# Patient Record
Sex: Female | Born: 1945 | Race: White | Hispanic: No | Marital: Married | Smoking: Never smoker
Health system: Southern US, Community
[De-identification: ages and names within clinical notes are randomized; demographics above are authoritative.]

## PROBLEM LIST (undated history)

## (undated) DIAGNOSIS — K219 Gastro-esophageal reflux disease without esophagitis: Secondary | ICD-10-CM

## (undated) DIAGNOSIS — E7800 Pure hypercholesterolemia, unspecified: Secondary | ICD-10-CM

## (undated) DIAGNOSIS — M549 Dorsalgia, unspecified: Secondary | ICD-10-CM

## (undated) DIAGNOSIS — I10 Essential (primary) hypertension: Secondary | ICD-10-CM

## (undated) DIAGNOSIS — M1990 Unspecified osteoarthritis, unspecified site: Secondary | ICD-10-CM

## (undated) DIAGNOSIS — M810 Age-related osteoporosis without current pathological fracture: Secondary | ICD-10-CM

## (undated) HISTORY — DX: Age-related osteoporosis without current pathological fracture: M81.0

## (undated) HISTORY — DX: Gastro-esophageal reflux disease without esophagitis: K21.9

## (undated) HISTORY — PX: ABDOMINAL HYSTERECTOMY: SHX81

## (undated) HISTORY — PX: WRIST SURGERY: SHX841

## (undated) HISTORY — PX: COLONOSCOPY: SHX174

---

## 2007-02-02 DIAGNOSIS — C50919 Malignant neoplasm of unspecified site of unspecified female breast: Secondary | ICD-10-CM

## 2007-02-02 HISTORY — DX: Malignant neoplasm of unspecified site of unspecified female breast: C50.919

## 2007-11-02 HISTORY — PX: MASTECTOMY: SHX3

## 2010-08-14 ENCOUNTER — Other Ambulatory Visit: Payer: Self-pay | Admitting: Neurosurgery

## 2010-08-14 DIAGNOSIS — M48061 Spinal stenosis, lumbar region without neurogenic claudication: Secondary | ICD-10-CM

## 2010-08-17 ENCOUNTER — Ambulatory Visit
Admission: RE | Admit: 2010-08-17 | Discharge: 2010-08-17 | Disposition: A | Discharge status: Home / Self Care | Payer: Medicare Other | Source: Ambulatory Visit | Attending: Neurosurgery | Admitting: Neurosurgery

## 2010-08-17 DIAGNOSIS — M48061 Spinal stenosis, lumbar region without neurogenic claudication: Secondary | ICD-10-CM

## 2010-08-21 ENCOUNTER — Other Ambulatory Visit: Payer: Self-pay

## 2010-08-25 ENCOUNTER — Other Ambulatory Visit: Payer: Self-pay | Admitting: Neurosurgery

## 2010-08-25 DIAGNOSIS — Z13820 Encounter for screening for osteoporosis: Secondary | ICD-10-CM

## 2010-08-31 ENCOUNTER — Other Ambulatory Visit: Payer: Self-pay | Admitting: Neurosurgery

## 2010-08-31 ENCOUNTER — Ambulatory Visit
Admission: RE | Admit: 2010-08-31 | Discharge: 2010-08-31 | Disposition: A | Discharge status: Home / Self Care | Payer: Medicare Other | Source: Ambulatory Visit | Attending: Neurosurgery | Admitting: Neurosurgery

## 2010-08-31 DIAGNOSIS — IMO0002 Reserved for concepts with insufficient information to code with codable children: Secondary | ICD-10-CM

## 2010-08-31 DIAGNOSIS — Z13820 Encounter for screening for osteoporosis: Secondary | ICD-10-CM

## 2010-08-31 DIAGNOSIS — M4310 Spondylolisthesis, site unspecified: Secondary | ICD-10-CM

## 2010-11-17 DIAGNOSIS — M47816 Spondylosis without myelopathy or radiculopathy, lumbar region: Secondary | ICD-10-CM | POA: Insufficient documentation

## 2010-11-17 DIAGNOSIS — I10 Essential (primary) hypertension: Secondary | ICD-10-CM | POA: Insufficient documentation

## 2012-08-31 ENCOUNTER — Other Ambulatory Visit: Payer: Self-pay | Admitting: Family Medicine

## 2012-08-31 DIAGNOSIS — M8580 Other specified disorders of bone density and structure, unspecified site: Secondary | ICD-10-CM

## 2012-09-08 ENCOUNTER — Other Ambulatory Visit: Payer: Self-pay

## 2012-09-08 DIAGNOSIS — Z1231 Encounter for screening mammogram for malignant neoplasm of breast: Secondary | ICD-10-CM

## 2012-09-11 ENCOUNTER — Other Ambulatory Visit: Payer: Medicare Other

## 2012-09-29 ENCOUNTER — Ambulatory Visit
Admission: RE | Admit: 2012-09-29 | Discharge: 2012-09-29 | Disposition: A | Discharge status: Home / Self Care | Payer: Medicare Other | Source: Ambulatory Visit | Attending: Family Medicine | Admitting: Family Medicine

## 2012-09-29 ENCOUNTER — Ambulatory Visit: Payer: Medicare Other

## 2012-09-29 ENCOUNTER — Ambulatory Visit
Admission: RE | Admit: 2012-09-29 | Discharge: 2012-09-29 | Disposition: A | Discharge status: Home / Self Care | Payer: Medicare Other | Source: Ambulatory Visit

## 2012-09-29 DIAGNOSIS — M8580 Other specified disorders of bone density and structure, unspecified site: Secondary | ICD-10-CM

## 2012-09-29 DIAGNOSIS — Z1231 Encounter for screening mammogram for malignant neoplasm of breast: Secondary | ICD-10-CM

## 2012-11-13 DIAGNOSIS — N8110 Cystocele, unspecified: Secondary | ICD-10-CM | POA: Insufficient documentation

## 2013-01-23 DIAGNOSIS — C50919 Malignant neoplasm of unspecified site of unspecified female breast: Secondary | ICD-10-CM | POA: Insufficient documentation

## 2013-01-23 DIAGNOSIS — Z853 Personal history of malignant neoplasm of breast: Secondary | ICD-10-CM | POA: Insufficient documentation

## 2014-04-09 ENCOUNTER — Other Ambulatory Visit: Payer: Self-pay

## 2014-04-09 DIAGNOSIS — Z1231 Encounter for screening mammogram for malignant neoplasm of breast: Secondary | ICD-10-CM

## 2014-04-09 DIAGNOSIS — Z9012 Acquired absence of left breast and nipple: Secondary | ICD-10-CM

## 2014-04-26 ENCOUNTER — Ambulatory Visit
Admission: RE | Admit: 2014-04-26 | Discharge: 2014-04-26 | Disposition: A | Discharge status: Home / Self Care | Payer: Medicare HMO | Source: Ambulatory Visit

## 2014-04-26 DIAGNOSIS — Z1231 Encounter for screening mammogram for malignant neoplasm of breast: Secondary | ICD-10-CM

## 2014-04-26 DIAGNOSIS — Z9012 Acquired absence of left breast and nipple: Secondary | ICD-10-CM

## 2015-09-03 ENCOUNTER — Other Ambulatory Visit: Payer: Self-pay | Admitting: Family Medicine

## 2015-09-03 DIAGNOSIS — Z1231 Encounter for screening mammogram for malignant neoplasm of breast: Secondary | ICD-10-CM

## 2015-09-12 ENCOUNTER — Ambulatory Visit: Payer: Medicare HMO

## 2015-10-28 ENCOUNTER — Ambulatory Visit
Admission: RE | Admit: 2015-10-28 | Discharge: 2015-10-28 | Disposition: A | Discharge status: Home / Self Care | Payer: Medicare Other | Source: Ambulatory Visit | Attending: Family Medicine | Admitting: Family Medicine

## 2015-10-28 DIAGNOSIS — Z1231 Encounter for screening mammogram for malignant neoplasm of breast: Secondary | ICD-10-CM

## 2016-12-07 ENCOUNTER — Other Ambulatory Visit: Payer: Self-pay

## 2016-12-07 ENCOUNTER — Other Ambulatory Visit: Payer: Self-pay | Admitting: Family Medicine

## 2016-12-07 DIAGNOSIS — N644 Mastodynia: Secondary | ICD-10-CM

## 2016-12-17 ENCOUNTER — Ambulatory Visit
Admission: RE | Admit: 2016-12-17 | Discharge: 2016-12-17 | Disposition: A | Discharge status: Home / Self Care | Payer: Medicare Other | Source: Ambulatory Visit

## 2016-12-17 ENCOUNTER — Ambulatory Visit: Payer: Medicare Other

## 2016-12-17 ENCOUNTER — Ambulatory Visit
Admission: RE | Admit: 2016-12-17 | Discharge: 2016-12-17 | Disposition: A | Discharge status: Home / Self Care | Payer: Medicare Other | Source: Ambulatory Visit | Attending: Family Medicine | Admitting: Family Medicine

## 2016-12-17 ENCOUNTER — Other Ambulatory Visit: Payer: Self-pay | Admitting: Family Medicine

## 2016-12-17 DIAGNOSIS — N644 Mastodynia: Secondary | ICD-10-CM

## 2017-05-05 DIAGNOSIS — E785 Hyperlipidemia, unspecified: Secondary | ICD-10-CM | POA: Insufficient documentation

## 2017-05-19 ENCOUNTER — Other Ambulatory Visit: Payer: Self-pay

## 2017-05-19 ENCOUNTER — Encounter (HOSPITAL_BASED_OUTPATIENT_CLINIC_OR_DEPARTMENT_OTHER): Payer: Self-pay | Admitting: Emergency Medicine

## 2017-05-19 ENCOUNTER — Emergency Department (HOSPITAL_BASED_OUTPATIENT_CLINIC_OR_DEPARTMENT_OTHER)
Admission: EM | Admit: 2017-05-19 | Discharge: 2017-05-19 | Disposition: A | Discharge status: Home / Self Care | Payer: Medicare Other | Attending: Emergency Medicine | Admitting: Emergency Medicine

## 2017-05-19 DIAGNOSIS — M25532 Pain in left wrist: Secondary | ICD-10-CM | POA: Insufficient documentation

## 2017-05-19 DIAGNOSIS — I10 Essential (primary) hypertension: Secondary | ICD-10-CM | POA: Insufficient documentation

## 2017-05-19 DIAGNOSIS — Z79899 Other long term (current) drug therapy: Secondary | ICD-10-CM | POA: Insufficient documentation

## 2017-05-19 DIAGNOSIS — G8918 Other acute postprocedural pain: Secondary | ICD-10-CM | POA: Insufficient documentation

## 2017-05-19 HISTORY — DX: Dorsalgia, unspecified: M54.9

## 2017-05-19 HISTORY — DX: Pure hypercholesterolemia, unspecified: E78.00

## 2017-05-19 HISTORY — DX: Essential (primary) hypertension: I10

## 2017-05-19 HISTORY — DX: Unspecified osteoarthritis, unspecified site: M19.90

## 2017-05-19 MED ORDER — HYDROMORPHONE HCL 1 MG/ML IJ SOLN
1.0000 mg | Freq: Once | INTRAMUSCULAR | Status: AC
  Administered 2017-05-19: 1 mg via INTRAVENOUS
  Filled 2017-05-19: qty 1

## 2017-05-19 NOTE — Discharge Instructions (Addendum)
Continue your medications as previously prescribed.  Follow-up with Dr. Apolonio Schneiders as scheduled, and return to the ER if symptoms significantly worsen or change.

## 2017-05-19 NOTE — ED Provider Notes (Signed)
[facility] EMERGENCY DEPARTMENT Provider Note   CSN: [id number] Arrival date & time: 05/19/17  0415     History   Chief Complaint Chief Complaint  Patient presents with  . Post-op Problem    HPI Misty Mason is a 72 y.o. female.  This patient is a 72 year old female with past medical history of hypertension, hypercholesterolemia, and recent ORIF of the left wrist.  She presents today for evaluation of postoperative pain.  Dr. Apolonio Schneiders performed surgery yesterday and she was discharged to home.  This evening, her pain medicine has worn off and she is complaining of severe pain, "like someone is tearing my arm off".  She denies any numbness or tingling of the fingers.  She denies any new injury or trauma.  The history is provided by the patient.    Past Medical History:  Diagnosis Date  . Arthritis   . Back pain   . High cholesterol   . Hypertension     There are no active problems to display for this patient.   Past Surgical History:  Procedure Laterality Date  . MASTECTOMY Left 11/2007  . WRIST SURGERY       OB History   None      Home Medications    Prior to Admission medications   Medication Sig Start Date End Date Taking? Authorizing Provider  atorvastatin (LIPITOR) 40 MG tablet Take 40 mg by mouth 1 day or 1 dose. 03/07/17  Yes [provider]  hydroxychloroquine (PLAQUENIL) 200 MG tablet Take 200 mg by mouth 1 day or 1 dose. 03/02/17   [provider]  ondansetron (ZOFRAN) 4 MG tablet Take 4 mg by mouth as needed. 05/06/17   [provider]  oxyCODONE-acetaminophen (PERCOCET/ROXICET) 5-325 MG tablet Take 5-325 tablets by mouth as needed. 05/18/17   [provider]  traZODone (DESYREL) 100 MG tablet Take 100 mg by mouth at bedtime. 04/09/17   [provider]    Family History No family history on file.  Social History Social History   Tobacco Use  . Smoking status: Never Smoker  . Smokeless tobacco:  Never Used  Substance Use Topics  . Alcohol use: Yes    Comment: drinks 2 glasses of wine q hs  . Drug use: Never     Allergies   Patient has no known allergies.   Review of Systems Review of Systems  All other systems reviewed and are negative.    Physical Exam Updated Vital Signs BP 126/63 (BP Location: Right Arm)   Pulse 74   Temp 98.1 F (36.7 C) (Oral)   Resp (!) 22   Ht 5\' 2"  (1.575 m)   Wt 77.1 kg (170 lb)   SpO2 97%   BMI 31.09 kg/m   Physical Exam  Constitutional: She is oriented to person, place, and time. She appears well-developed and well-nourished. No distress.  HENT:  Head: Normocephalic and atraumatic.  Neck: Normal range of motion. Neck supple.  Musculoskeletal:  The left forearm has a dressing and splint in place.  The fingertips are not significantly swollen.  She has brisk capillary refill and motor and sensation is intact all fingers.  Neurological: She is alert and oriented to person, place, and time.  Skin: Skin is warm and dry. She is not diaphoretic.  Nursing note and vitals reviewed.    ED Treatments / Results  Labs (all labs ordered are listed, but only abnormal results are displayed) Labs Reviewed - No data to display  EKG None  Radiology No results found.  Procedures Procedures (including critical care time)  Medications Ordered in ED Medications  HYDROmorphone (DILAUDID) injection 1 mg (has no administration in time range)     Initial Impression / Assessment and Plan / ED Course  I have reviewed the triage vital signs and the nursing notes.  Pertinent labs & imaging results that were available during my care of the patient were reviewed by me and considered in my medical decision making (see chart for details).  Patient is one day postop from ORIF of the left wrist performed by Dr. Apolonio Schneiders.  She is having severe pain that is unrelieved with her home meds.  She was sent here for pain control.  Her fingers appear well  perfused and I see no evidence that the cast is on too tight.  She was given 1 mg of IV Dilaudid and is now significantly improved.  She is requesting to go home.  Final Clinical Impressions(s) / ED Diagnoses   Final diagnoses:  None    ED Discharge Orders    None       Veryl Speak, MD 05/19/17 [phone]

## 2017-05-19 NOTE — ED Triage Notes (Signed)
Had ? ORIF to left wrist yesterday. Has taken Percoet 3 tabs since 0030 but continues to have 10/10 pain. Has splint intact to left arm, CMS intact, wiggles fingers freely.

## 2018-11-06 DIAGNOSIS — H903 Sensorineural hearing loss, bilateral: Secondary | ICD-10-CM | POA: Insufficient documentation

## 2018-11-06 DIAGNOSIS — H8111 Benign paroxysmal vertigo, right ear: Secondary | ICD-10-CM | POA: Insufficient documentation

## 2018-11-29 ENCOUNTER — Other Ambulatory Visit: Payer: Self-pay | Admitting: Family Medicine

## 2018-11-29 DIAGNOSIS — Z1231 Encounter for screening mammogram for malignant neoplasm of breast: Secondary | ICD-10-CM

## 2018-12-04 ENCOUNTER — Ambulatory Visit
Admission: RE | Admit: 2018-12-04 | Discharge: 2018-12-04 | Disposition: A | Discharge status: Home / Self Care | Payer: Medicare Other | Source: Ambulatory Visit | Attending: Family Medicine | Admitting: Family Medicine

## 2018-12-04 ENCOUNTER — Other Ambulatory Visit: Payer: Self-pay

## 2018-12-04 ENCOUNTER — Other Ambulatory Visit: Payer: Self-pay | Admitting: Family Medicine

## 2018-12-04 DIAGNOSIS — Z1231 Encounter for screening mammogram for malignant neoplasm of breast: Secondary | ICD-10-CM

## 2019-03-20 LAB — COLOGUARD: COLOGUARD: NEGATIVE

## 2019-10-04 DIAGNOSIS — M5137 Other intervertebral disc degeneration, lumbosacral region: Secondary | ICD-10-CM | POA: Insufficient documentation

## 2020-02-28 DIAGNOSIS — I10 Essential (primary) hypertension: Secondary | ICD-10-CM | POA: Diagnosis not present

## 2020-02-28 DIAGNOSIS — M810 Age-related osteoporosis without current pathological fracture: Secondary | ICD-10-CM | POA: Diagnosis not present

## 2020-02-28 DIAGNOSIS — R053 Chronic cough: Secondary | ICD-10-CM | POA: Diagnosis not present

## 2020-02-28 DIAGNOSIS — Z0000 Encounter for general adult medical examination without abnormal findings: Secondary | ICD-10-CM | POA: Diagnosis not present

## 2020-02-28 DIAGNOSIS — Z170 Estrogen receptor positive status [ER+]: Secondary | ICD-10-CM | POA: Diagnosis not present

## 2020-02-28 DIAGNOSIS — C50912 Malignant neoplasm of unspecified site of left female breast: Secondary | ICD-10-CM | POA: Diagnosis not present

## 2020-02-28 DIAGNOSIS — K219 Gastro-esophageal reflux disease without esophagitis: Secondary | ICD-10-CM | POA: Diagnosis not present

## 2020-02-28 DIAGNOSIS — E559 Vitamin D deficiency, unspecified: Secondary | ICD-10-CM | POA: Diagnosis not present

## 2020-02-28 DIAGNOSIS — Z136 Encounter for screening for cardiovascular disorders: Secondary | ICD-10-CM | POA: Diagnosis not present

## 2020-03-07 ENCOUNTER — Other Ambulatory Visit: Payer: Self-pay | Admitting: Family Medicine

## 2020-03-07 DIAGNOSIS — Z1231 Encounter for screening mammogram for malignant neoplasm of breast: Secondary | ICD-10-CM

## 2020-03-11 ENCOUNTER — Other Ambulatory Visit: Payer: Self-pay | Admitting: Family Medicine

## 2020-03-11 DIAGNOSIS — M810 Age-related osteoporosis without current pathological fracture: Secondary | ICD-10-CM

## 2020-03-25 ENCOUNTER — Ambulatory Visit
Admission: RE | Admit: 2020-03-25 | Discharge: 2020-03-25 | Disposition: A | Discharge status: Home / Self Care | Payer: Medicare HMO | Source: Ambulatory Visit | Attending: Family Medicine | Admitting: Family Medicine

## 2020-03-25 ENCOUNTER — Other Ambulatory Visit: Payer: Self-pay | Admitting: Family Medicine

## 2020-03-25 DIAGNOSIS — R053 Chronic cough: Secondary | ICD-10-CM

## 2020-03-25 DIAGNOSIS — L718 Other rosacea: Secondary | ICD-10-CM | POA: Diagnosis not present

## 2020-03-25 DIAGNOSIS — L578 Other skin changes due to chronic exposure to nonionizing radiation: Secondary | ICD-10-CM | POA: Diagnosis not present

## 2020-03-25 DIAGNOSIS — L570 Actinic keratosis: Secondary | ICD-10-CM | POA: Diagnosis not present

## 2020-04-08 ENCOUNTER — Ambulatory Visit: Payer: Medicare Other | Admitting: Gastroenterology

## 2020-04-10 ENCOUNTER — Ambulatory Visit: Payer: Medicare HMO | Admitting: Gastroenterology

## 2020-04-10 ENCOUNTER — Other Ambulatory Visit: Payer: Self-pay

## 2020-04-10 ENCOUNTER — Encounter: Payer: Self-pay | Admitting: Gastroenterology

## 2020-04-10 VITALS — BP 122/72 | HR 72 | Ht 62.0 in | Wt 172.2 lb

## 2020-04-10 DIAGNOSIS — R053 Chronic cough: Secondary | ICD-10-CM

## 2020-04-10 DIAGNOSIS — K219 Gastro-esophageal reflux disease without esophagitis: Secondary | ICD-10-CM | POA: Diagnosis not present

## 2020-04-10 MED ORDER — PANTOPRAZOLE SODIUM 40 MG PO TBEC: 40.0000 mg | DELAYED_RELEASE_TABLET | Freq: Two times a day (BID) | ORAL | 11 refills | Status: DC

## 2020-04-10 NOTE — Patient Instructions (Signed)
We have sent the following medications to your pharmacy for you to pick up at your convenience: pantoprazole 40 mg one tablet by mouth twice daily.   Patient advised to avoid spicy, acidic, citrus, chocolate, mints, fruit and fruit juices.  Limit the intake of caffeine, alcohol and Soda.  Don't exercise too soon after eating.  Don't lie down within 3-4 hours of eating.  Elevate the head of your bed.  You have been scheduled for an endoscopy. Please follow written instructions given to you at your visit today. If you use inhalers (even only as needed), please bring them with you on the day of your procedure.

## 2020-04-10 NOTE — Progress Notes (Signed)
History of Present Illness: This is a 75 year old female referred by Misty Mason, Misty Main, MD for the evaluation of a frequent cough for 2 years.  She relates frequent sinus drainage. Her cough is generally worse in the morning.  She relates an occasional sore throat after episodes of coughing.  She has infrequent episodes of heartburn brought on by certain foods and wine.  She was previously treated with famotidine which was changed to pantoprazole several months ago without a change in her cough.  She states she has occasional difficulty swallowing large pills however no solid food or liquid dysphagia.  Denies weight loss, abdominal pain, constipation, diarrhea, change in stool caliber, melena, hematochezia, nausea, vomiting, chest pain.    No Known Allergies Outpatient Medications Prior to Visit  Medication Sig Dispense Refill  . atorvastatin (LIPITOR) 40 MG tablet Take 40 mg by mouth daily.    . pantoprazole (PROTONIX) 40 MG tablet Take 1 tablet by mouth daily.    . traZODone (DESYREL) 100 MG tablet Take 100 mg by mouth at bedtime.    . valsartan-hydrochlorothiazide (DIOVAN-HCT) 80-12.5 MG tablet Take 1 tablet by mouth daily.    . hydroxychloroquine (PLAQUENIL) 200 MG tablet Take 200 mg by mouth 1 day or 1 dose. (Patient not taking: Reported on 04/10/2020)    . ondansetron (ZOFRAN) 4 MG tablet Take 4 mg by mouth as needed. (Patient not taking: Reported on 04/10/2020)  0  . oxyCODONE-acetaminophen (PERCOCET/ROXICET) 5-325 MG tablet Take 1 tablet by mouth every 6 (six) hours as needed.  (Patient not taking: Reported on 04/10/2020)     No facility-administered medications prior to visit.   Past Medical History:  Diagnosis Date  . Arthritis   . Back pain   . Breast cancer ([place]) 2009  . GERD (gastroesophageal reflux disease)   . High cholesterol   . Hypertension   . Osteoporosis    Past Surgical History:  Procedure Laterality Date  . ABDOMINAL HYSTERECTOMY    . COLONOSCOPY    . MASTECTOMY  Left 11/2007  . WRIST SURGERY     Social History   Socioeconomic History  . Marital status: Married    Spouse name: Not on file  . Number of children: Not on file  . Years of education: Not on file  . Highest education level: Not on file  Occupational History  . Not on file  Tobacco Use  . Smoking status: Never Smoker  . Smokeless tobacco: Never Used  Vaping Use  . Vaping Use: Never used  Substance and Sexual Activity  . Alcohol use: Yes    Comment: drinks 2 glasses of wine q hs  . Drug use: Never  . Sexual activity: Not on file  Other Topics Concern  . Not on file  Social History Narrative  . Not on file   Social Determinants of Health   Financial Resource Strain: Not on file  Food Insecurity: Not on file  Transportation Needs: Not on file  Physical Activity: Not on file  Stress: Not on file  Social Connections: Not on file   Family History  Problem Relation Age of Onset  . Heart attack Sister   . Colon cancer Neg Hx   . Esophageal cancer Neg Hx   . Pancreatic cancer Neg Hx   . Stomach cancer Neg Hx     Review of Systems: Pertinent positive and negative review of systems were noted in the above HPI section. All other review of systems were otherwise negative.  Physical Exam: General: Well developed, well nourished, no acute distress Head: Normocephalic and atraumatic Eyes: Sclerae anicteric, EOMI Ears: Normal auditory acuity Mouth: Not examined, mask on during Covid-19 pandemic Neck: Supple, no masses or thyromegaly Lungs: Clear throughout to auscultation Heart: Regular rate and rhythm; no murmurs, rubs or bruits Abdomen: Soft, non tender and non distended. No masses, hepatosplenomegaly or hernias noted. Normal Bowel sounds Rectal: Not done Musculoskeletal: Symmetrical with no gross deformities  Skin: No lesions on visible extremities Pulses:  Normal pulses noted Extremities: No clubbing, cyanosis, edema or deformities noted Neurological: Alert  oriented x 4, grossly nonfocal Cervical Nodes:  No significant cervical adenopathy Inguinal Nodes: No significant inguinal adenopathy Psychological:  Alert and cooperative. Normal mood and affect   Assessment and Recommendations:  1.  Chronic cough.  Mild GERD.  No other typical symptoms of LPR.  Concern for pulmonary and ENT etiologies.  Recommend further evaluation with pulmonary and ENT -defer decision to her PCP.  Further assess for possible GERD related cough.  Closely follow antireflux measures.  Increase pantoprazole to 40 mg twice daily.  Schedule EGD. The risks (including bleeding, perforation, infection, missed lesions, medication reactions and possible hospitalization or surgery if complications occur), benefits, and alternatives to endoscopy with possible biopsy and possible dilation were discussed with the patient and they consent to proceed.   2.  Colorectal cancer screening.  Cologuard performed March 13, 2019.     cc: [place] [address] [address] [place],  [place] [address]

## 2020-04-11 ENCOUNTER — Other Ambulatory Visit: Payer: Self-pay | Admitting: Gastroenterology

## 2020-04-11 ENCOUNTER — Other Ambulatory Visit: Payer: Self-pay

## 2020-04-11 ENCOUNTER — Ambulatory Visit (AMBULATORY_SURGERY_CENTER): Payer: Medicare HMO | Admitting: Gastroenterology

## 2020-04-11 ENCOUNTER — Encounter: Payer: Self-pay | Admitting: Gastroenterology

## 2020-04-11 VITALS — BP 161/76 | HR 68 | Temp 97.8°F | Resp 12 | Ht 62.0 in | Wt 172.0 lb

## 2020-04-11 DIAGNOSIS — R1310 Dysphagia, unspecified: Secondary | ICD-10-CM

## 2020-04-11 DIAGNOSIS — K2950 Unspecified chronic gastritis without bleeding: Secondary | ICD-10-CM | POA: Diagnosis not present

## 2020-04-11 DIAGNOSIS — R059 Cough, unspecified: Secondary | ICD-10-CM | POA: Diagnosis not present

## 2020-04-11 DIAGNOSIS — R053 Chronic cough: Secondary | ICD-10-CM

## 2020-04-11 DIAGNOSIS — K2970 Gastritis, unspecified, without bleeding: Secondary | ICD-10-CM

## 2020-04-11 MED ORDER — SODIUM CHLORIDE 0.9 % IV SOLN: 500.0000 mL | Freq: Once | INTRAVENOUS | Status: DC

## 2020-04-11 NOTE — Progress Notes (Signed)
PT taken to PACU. Monitors in place. VSS. Report given to RN. 

## 2020-04-11 NOTE — Progress Notes (Signed)
Called to room to assist during endoscopic procedure.  Patient ID and intended procedure confirmed with present staff. Received instructions for my participation in the procedure from the performing physician.  

## 2020-04-11 NOTE — Op Note (Signed)
[facility] Patient Name: Misty Mason Procedure Date: 04/11/2020 2:59 PM MRN: [id number] Endoscopist: Ladene Artist , MD Age: 75 Referring MD:  Date of Birth: 1945-10-08 Gender: Female Account #: [id number] Procedure:                Upper GI endoscopy Indications:              Dysphagia, Chronic cough Medicines:                Monitored Anesthesia Care Procedure:                Pre-Anesthesia Assessment:                           - Prior to the procedure, a History and Physical                            was performed, and patient medications and                            allergies were reviewed. The patient's tolerance of                            previous anesthesia was also reviewed. The risks                            and benefits of the procedure and the sedation                            options and risks were discussed with the patient.                            All questions were answered, and informed consent                            was obtained. Prior Anticoagulants: The patient has                            taken no previous anticoagulant or antiplatelet                            agents. ASA Grade Assessment: II - A patient with                            mild systemic disease. After reviewing the risks                            and benefits, the patient was deemed in                            satisfactory condition to undergo the procedure.                           After obtaining informed consent, the endoscope was  passed under direct vision. Throughout the                            procedure, the patient's blood pressure, pulse, and                            oxygen saturations were monitored continuously. The                            Endoscope was introduced through the mouth, and                            advanced to the second part of duodenum. The upper                            GI endoscopy was accomplished  without difficulty.                            The patient tolerated the procedure well. Scope In: Scope Out: Findings:                 No endoscopic abnormality was evident in the                            esophagus to explain the patient's complaint of                            dysphagia. It was decided, however, to proceed with                            dilation of the entire esophagus. A guidewire was                            placed and the scope was withdrawn. Dilation was                            performed with a Savary dilator with no resistance                            at 17 mm. Biopsies were taken in the distal                            esophagus with a cold forceps for histology.                           Patchy mildly erythematous mucosa without bleeding                            was found in the gastric body. Biopsies were taken                            with a cold forceps for histology.  The exam of the stomach was otherwise normal.                           The duodenal bulb and second portion of the                            duodenum were normal. Complications:            No immediate complications. Estimated Blood Loss:     Estimated blood loss was minimal. Impression:               - No endoscopic esophageal abnormality to explain                            patient's dysphagia. Esophagus dilated. Biopsied.                           - Erythematous mucosa in the gastric body. Biopsied.                           - Normal duodenal bulb and second portion of the                            duodenum. Recommendation:           - Patient has a contact number available for                            emergencies. The signs and symptoms of potential                            delayed complications were discussed with the                            patient. Return to normal activities tomorrow.                            Written discharge  instructions were provided to the                            patient.                           - Clear liquid diet for 2 hours, then advance as                            tolerated to soft diet today.                           - Resume prior diet tomorrow.                           - Continue present medications.                           - Follow antireflux measures.                           -  Await pathology results.                           - PCP to consider ENT and Pulmonary referrals. Ladene Artist, MD 04/11/2020 3:26:56 PM This report has been signed electronically.

## 2020-04-11 NOTE — Progress Notes (Signed)
Vitals-CW  History reviewed. 

## 2020-04-11 NOTE — Patient Instructions (Signed)
clear liquids til 1725 then advance to soft diet  for the rest of today(handout given) Continue regular diet tomorrow Continue  Current medications Follow antireflux measures( Given) Await pathology results YOU HAD AN ENDOSCOPIC PROCEDURE TODAY AT [facility]:   Refer to the procedure report that was given to you for any specific questions about what was found during the examination.  If the procedure report does not answer your questions, please call your gastroenterologist to clarify.  If you requested that your care partner not be given the details of your procedure findings, then the procedure report has been included in a sealed envelope for you to review at your convenience later.  YOU SHOULD EXPECT: Some feelings of bloating in the abdomen. Passage of more gas than usual.  Walking can help get rid of the air that was put into your GI tract during the procedure and reduce the bloating. If you had a lower endoscopy (such as a colonoscopy or flexible sigmoidoscopy) you may notice spotting of blood in your stool or on the toilet paper. If you underwent a bowel prep for your procedure, you may not have a normal bowel movement for a few days.  Please Note:  You might notice some irritation and congestion in your nose or some drainage.  This is from the oxygen used during your procedure.  There is no need for concern and it should clear up in a day or so.  SYMPTOMS TO REPORT IMMEDIATELY:  Following upper endoscopy (EGD)  Vomiting of blood or coffee ground material  New chest pain or pain under the shoulder blades  Painful or persistently difficult swallowing  New shortness of breath  Fever of 100F or higher  Black, tarry-looking stools  For urgent or emergent issues, a gastroenterologist can be reached at any hour by calling [phone]. Do not use MyChart messaging for urgent concerns.   DIET:  We do recommend a small meal at first, but then you may proceed to your  regular diet.  Drink plenty of fluids but you should avoid alcoholic beverages for 24 hours.  ACTIVITY:  You should plan to take it easy for the rest of today and you should NOT DRIVE or use heavy machinery until tomorrow (because of the sedation medicines used during the test).    FOLLOW UP: Our staff will call the number listed on your records 48-72 hours following your procedure to check on you and address any questions or concerns that you may have regarding the information given to you following your procedure. If we do not reach you, we will leave a message.  We will attempt to reach you two times.  During this call, we will ask if you have developed any symptoms of COVID 19. If you develop any symptoms (ie: fever, flu-like symptoms, shortness of breath, cough etc.) before then, please call [phone].  If you test positive for Covid 19 in the 2 weeks post procedure, please call and report this information to [place].    If any biopsies were taken you   SIGNATURES/CONFIDENTIALITY: You and/or your care partner have signed paperwork which will be entered into your electronic medical record.  These signatures attest to the fact that that the information above on your After Visit Summary has been reviewed and is understood.  Full responsibility of the confidentiality of this discharge information lies with you and/or your care-partner.

## 2020-04-16 ENCOUNTER — Telehealth: Payer: Self-pay

## 2020-04-16 NOTE — Telephone Encounter (Signed)
LVM

## 2020-04-22 ENCOUNTER — Encounter: Payer: Self-pay | Admitting: Gastroenterology

## 2020-04-23 ENCOUNTER — Ambulatory Visit
Admission: RE | Admit: 2020-04-23 | Discharge: 2020-04-23 | Disposition: A | Discharge status: Home / Self Care | Payer: Medicare HMO | Source: Ambulatory Visit | Attending: Family Medicine | Admitting: Family Medicine

## 2020-04-23 ENCOUNTER — Other Ambulatory Visit: Payer: Self-pay

## 2020-04-23 ENCOUNTER — Ambulatory Visit: Payer: Medicare Other

## 2020-04-23 DIAGNOSIS — Z1231 Encounter for screening mammogram for malignant neoplasm of breast: Secondary | ICD-10-CM | POA: Diagnosis not present

## 2020-04-30 ENCOUNTER — Encounter: Payer: Medicare HMO | Admitting: Gastroenterology

## 2020-07-07 DIAGNOSIS — I10 Essential (primary) hypertension: Secondary | ICD-10-CM | POA: Diagnosis not present

## 2020-07-07 DIAGNOSIS — E782 Mixed hyperlipidemia: Secondary | ICD-10-CM | POA: Diagnosis not present

## 2020-07-07 DIAGNOSIS — Z789 Other specified health status: Secondary | ICD-10-CM | POA: Diagnosis not present

## 2020-07-07 DIAGNOSIS — R739 Hyperglycemia, unspecified: Secondary | ICD-10-CM | POA: Diagnosis not present

## 2020-07-16 DIAGNOSIS — M810 Age-related osteoporosis without current pathological fracture: Secondary | ICD-10-CM | POA: Diagnosis not present

## 2020-07-16 DIAGNOSIS — E785 Hyperlipidemia, unspecified: Secondary | ICD-10-CM | POA: Diagnosis not present

## 2020-07-16 DIAGNOSIS — F419 Anxiety disorder, unspecified: Secondary | ICD-10-CM | POA: Diagnosis not present

## 2020-07-16 DIAGNOSIS — G4700 Insomnia, unspecified: Secondary | ICD-10-CM | POA: Diagnosis not present

## 2020-07-16 DIAGNOSIS — K5900 Constipation, unspecified: Secondary | ICD-10-CM | POA: Diagnosis not present

## 2020-07-16 DIAGNOSIS — E669 Obesity, unspecified: Secondary | ICD-10-CM | POA: Diagnosis not present

## 2020-07-16 DIAGNOSIS — I10 Essential (primary) hypertension: Secondary | ICD-10-CM | POA: Diagnosis not present

## 2020-07-16 DIAGNOSIS — F324 Major depressive disorder, single episode, in partial remission: Secondary | ICD-10-CM | POA: Diagnosis not present

## 2020-07-16 DIAGNOSIS — G8929 Other chronic pain: Secondary | ICD-10-CM | POA: Diagnosis not present

## 2020-07-16 DIAGNOSIS — M545 Low back pain, unspecified: Secondary | ICD-10-CM | POA: Diagnosis not present

## 2020-07-16 DIAGNOSIS — K219 Gastro-esophageal reflux disease without esophagitis: Secondary | ICD-10-CM | POA: Diagnosis not present

## 2020-07-16 DIAGNOSIS — M1990 Unspecified osteoarthritis, unspecified site: Secondary | ICD-10-CM | POA: Diagnosis not present

## 2020-08-08 ENCOUNTER — Ambulatory Visit
Admission: RE | Admit: 2020-08-08 | Discharge: 2020-08-08 | Disposition: A | Discharge status: Home / Self Care | Payer: Medicare Other | Source: Ambulatory Visit | Attending: Family Medicine | Admitting: Family Medicine

## 2020-08-08 ENCOUNTER — Other Ambulatory Visit: Payer: Self-pay

## 2020-08-08 DIAGNOSIS — M810 Age-related osteoporosis without current pathological fracture: Secondary | ICD-10-CM

## 2020-09-18 DIAGNOSIS — M7061 Trochanteric bursitis, right hip: Secondary | ICD-10-CM | POA: Diagnosis not present

## 2020-09-18 DIAGNOSIS — M4316 Spondylolisthesis, lumbar region: Secondary | ICD-10-CM | POA: Diagnosis not present

## 2020-09-18 DIAGNOSIS — M48062 Spinal stenosis, lumbar region with neurogenic claudication: Secondary | ICD-10-CM | POA: Diagnosis not present

## 2020-11-14 DIAGNOSIS — S52201A Unspecified fracture of shaft of right ulna, initial encounter for closed fracture: Secondary | ICD-10-CM | POA: Diagnosis not present

## 2020-11-27 DIAGNOSIS — S52201D Unspecified fracture of shaft of right ulna, subsequent encounter for closed fracture with routine healing: Secondary | ICD-10-CM | POA: Diagnosis not present

## 2020-11-27 DIAGNOSIS — M25531 Pain in right wrist: Secondary | ICD-10-CM | POA: Diagnosis not present

## 2020-11-28 DIAGNOSIS — S52521A Torus fracture of lower end of right radius, initial encounter for closed fracture: Secondary | ICD-10-CM | POA: Diagnosis not present

## 2021-01-06 DIAGNOSIS — S52521A Torus fracture of lower end of right radius, initial encounter for closed fracture: Secondary | ICD-10-CM | POA: Diagnosis not present

## 2021-01-12 DIAGNOSIS — F5102 Adjustment insomnia: Secondary | ICD-10-CM | POA: Diagnosis not present

## 2021-01-12 DIAGNOSIS — E559 Vitamin D deficiency, unspecified: Secondary | ICD-10-CM | POA: Diagnosis not present

## 2021-01-12 DIAGNOSIS — E538 Deficiency of other specified B group vitamins: Secondary | ICD-10-CM | POA: Diagnosis not present

## 2021-01-12 DIAGNOSIS — Z0000 Encounter for general adult medical examination without abnormal findings: Secondary | ICD-10-CM | POA: Diagnosis not present

## 2021-01-12 DIAGNOSIS — R739 Hyperglycemia, unspecified: Secondary | ICD-10-CM | POA: Diagnosis not present

## 2021-01-12 DIAGNOSIS — I10 Essential (primary) hypertension: Secondary | ICD-10-CM | POA: Diagnosis not present

## 2021-01-12 DIAGNOSIS — E782 Mixed hyperlipidemia: Secondary | ICD-10-CM | POA: Diagnosis not present

## 2021-01-12 DIAGNOSIS — M48062 Spinal stenosis, lumbar region with neurogenic claudication: Secondary | ICD-10-CM | POA: Diagnosis not present

## 2021-01-12 DIAGNOSIS — Z8639 Personal history of other endocrine, nutritional and metabolic disease: Secondary | ICD-10-CM | POA: Diagnosis not present

## 2021-01-12 DIAGNOSIS — M8000XG Age-related osteoporosis with current pathological fracture, unspecified site, subsequent encounter for fracture with delayed healing: Secondary | ICD-10-CM | POA: Diagnosis not present

## 2021-02-17 DIAGNOSIS — S52521A Torus fracture of lower end of right radius, initial encounter for closed fracture: Secondary | ICD-10-CM | POA: Diagnosis not present

## 2021-03-10 DIAGNOSIS — M25531 Pain in right wrist: Secondary | ICD-10-CM | POA: Diagnosis not present

## 2021-03-10 DIAGNOSIS — S52521A Torus fracture of lower end of right radius, initial encounter for closed fracture: Secondary | ICD-10-CM | POA: Diagnosis not present

## 2021-03-20 DIAGNOSIS — M25531 Pain in right wrist: Secondary | ICD-10-CM | POA: Diagnosis not present

## 2021-03-26 DIAGNOSIS — M25531 Pain in right wrist: Secondary | ICD-10-CM | POA: Diagnosis not present

## 2021-07-23 DIAGNOSIS — Z170 Estrogen receptor positive status [ER+]: Secondary | ICD-10-CM | POA: Diagnosis not present

## 2021-07-23 DIAGNOSIS — Z0000 Encounter for general adult medical examination without abnormal findings: Secondary | ICD-10-CM | POA: Diagnosis not present

## 2021-07-23 DIAGNOSIS — M48062 Spinal stenosis, lumbar region with neurogenic claudication: Secondary | ICD-10-CM | POA: Diagnosis not present

## 2021-07-23 DIAGNOSIS — C50912 Malignant neoplasm of unspecified site of left female breast: Secondary | ICD-10-CM | POA: Diagnosis not present

## 2021-07-23 DIAGNOSIS — I10 Essential (primary) hypertension: Secondary | ICD-10-CM | POA: Diagnosis not present

## 2021-07-23 DIAGNOSIS — R739 Hyperglycemia, unspecified: Secondary | ICD-10-CM | POA: Diagnosis not present

## 2021-07-23 DIAGNOSIS — K219 Gastro-esophageal reflux disease without esophagitis: Secondary | ICD-10-CM | POA: Diagnosis not present

## 2021-07-23 DIAGNOSIS — M7061 Trochanteric bursitis, right hip: Secondary | ICD-10-CM | POA: Diagnosis not present

## 2021-07-23 DIAGNOSIS — E782 Mixed hyperlipidemia: Secondary | ICD-10-CM | POA: Diagnosis not present

## 2021-07-23 DIAGNOSIS — M8000XG Age-related osteoporosis with current pathological fracture, unspecified site, subsequent encounter for fracture with delayed healing: Secondary | ICD-10-CM | POA: Diagnosis not present

## 2021-08-21 IMAGING — CR DG CHEST 2V
2 series · 2 of 2 positions shown · non-contrast
Comparison: None

CLINICAL DATA: Persistent cough in a 74-year-old female.

EXAM:
CHEST - 2 VIEW

[w chest pa]
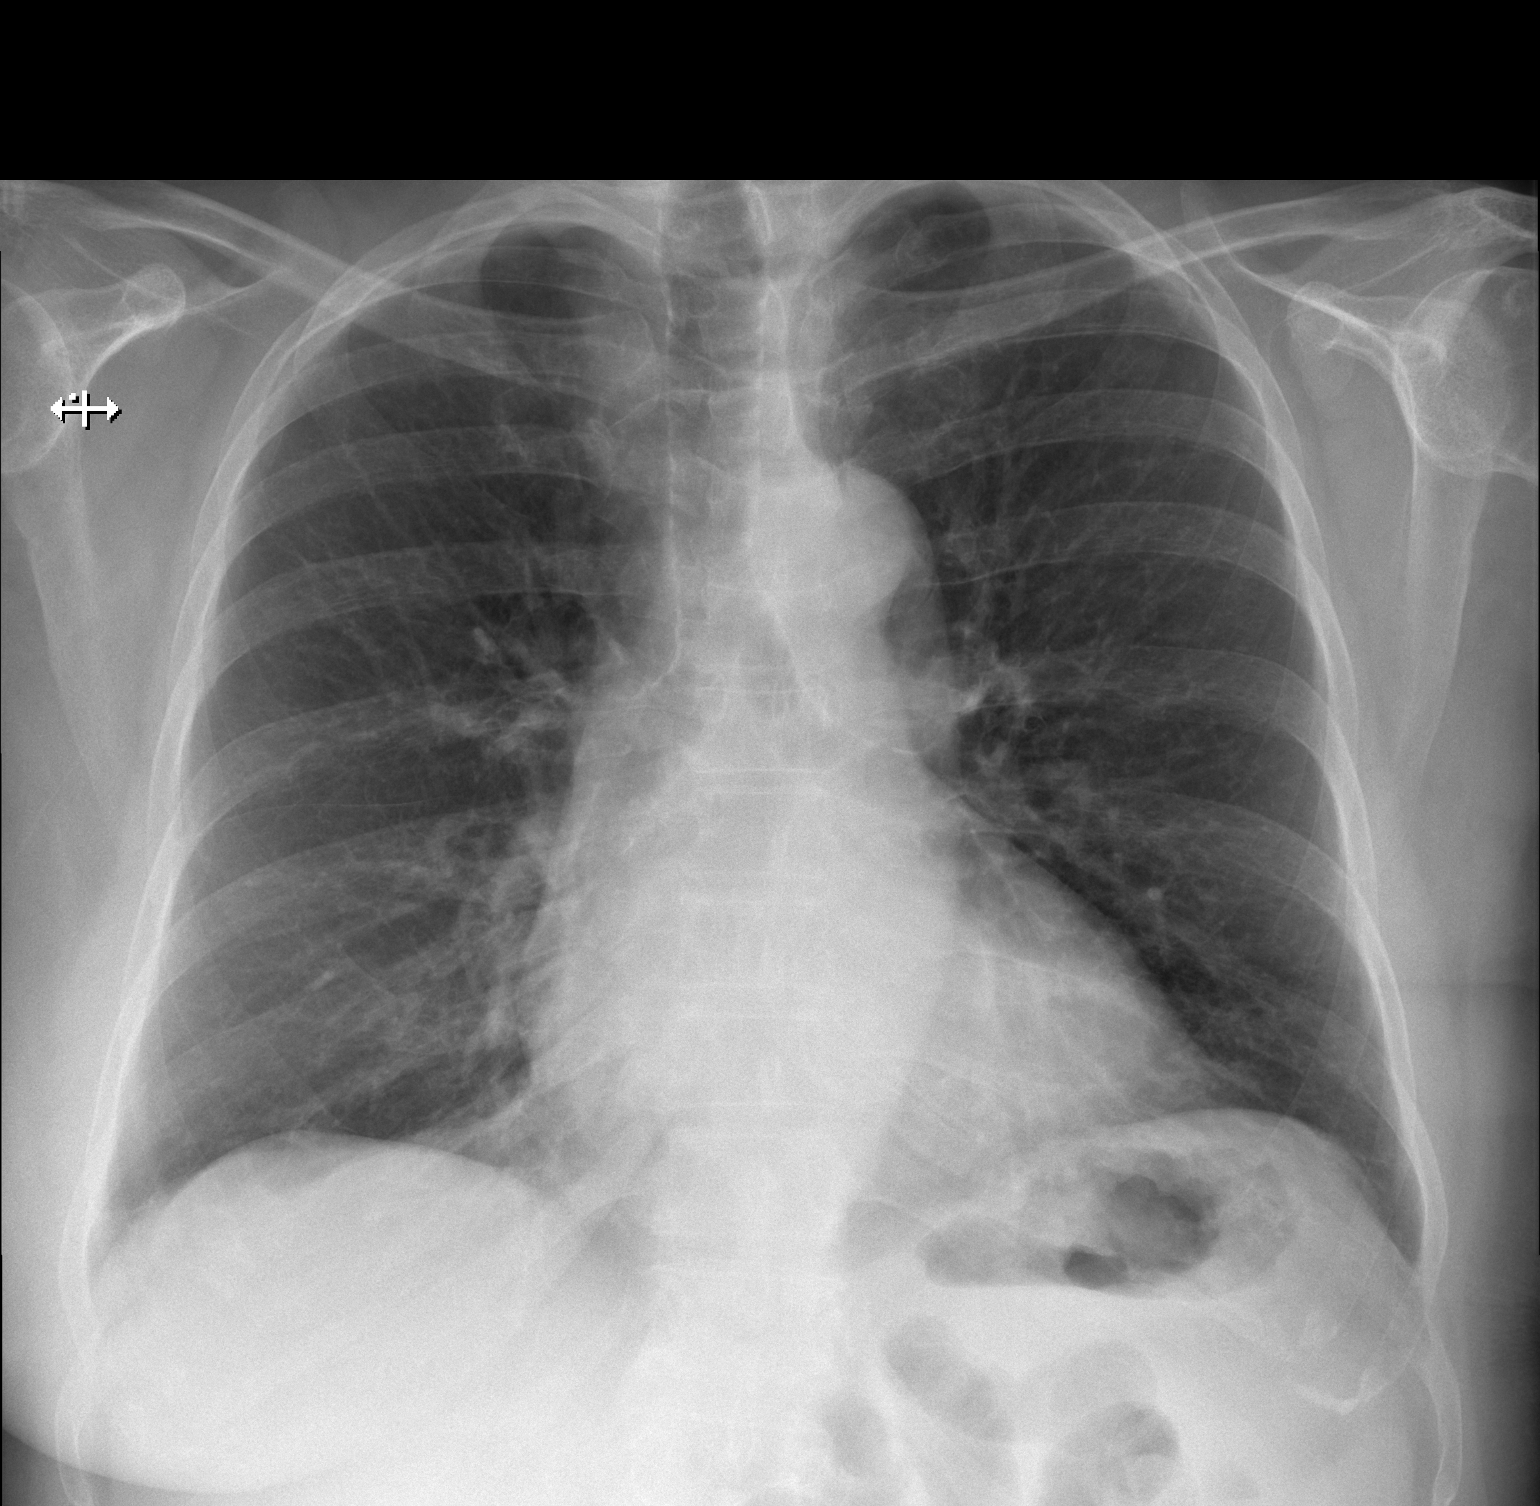

[w chest lat]
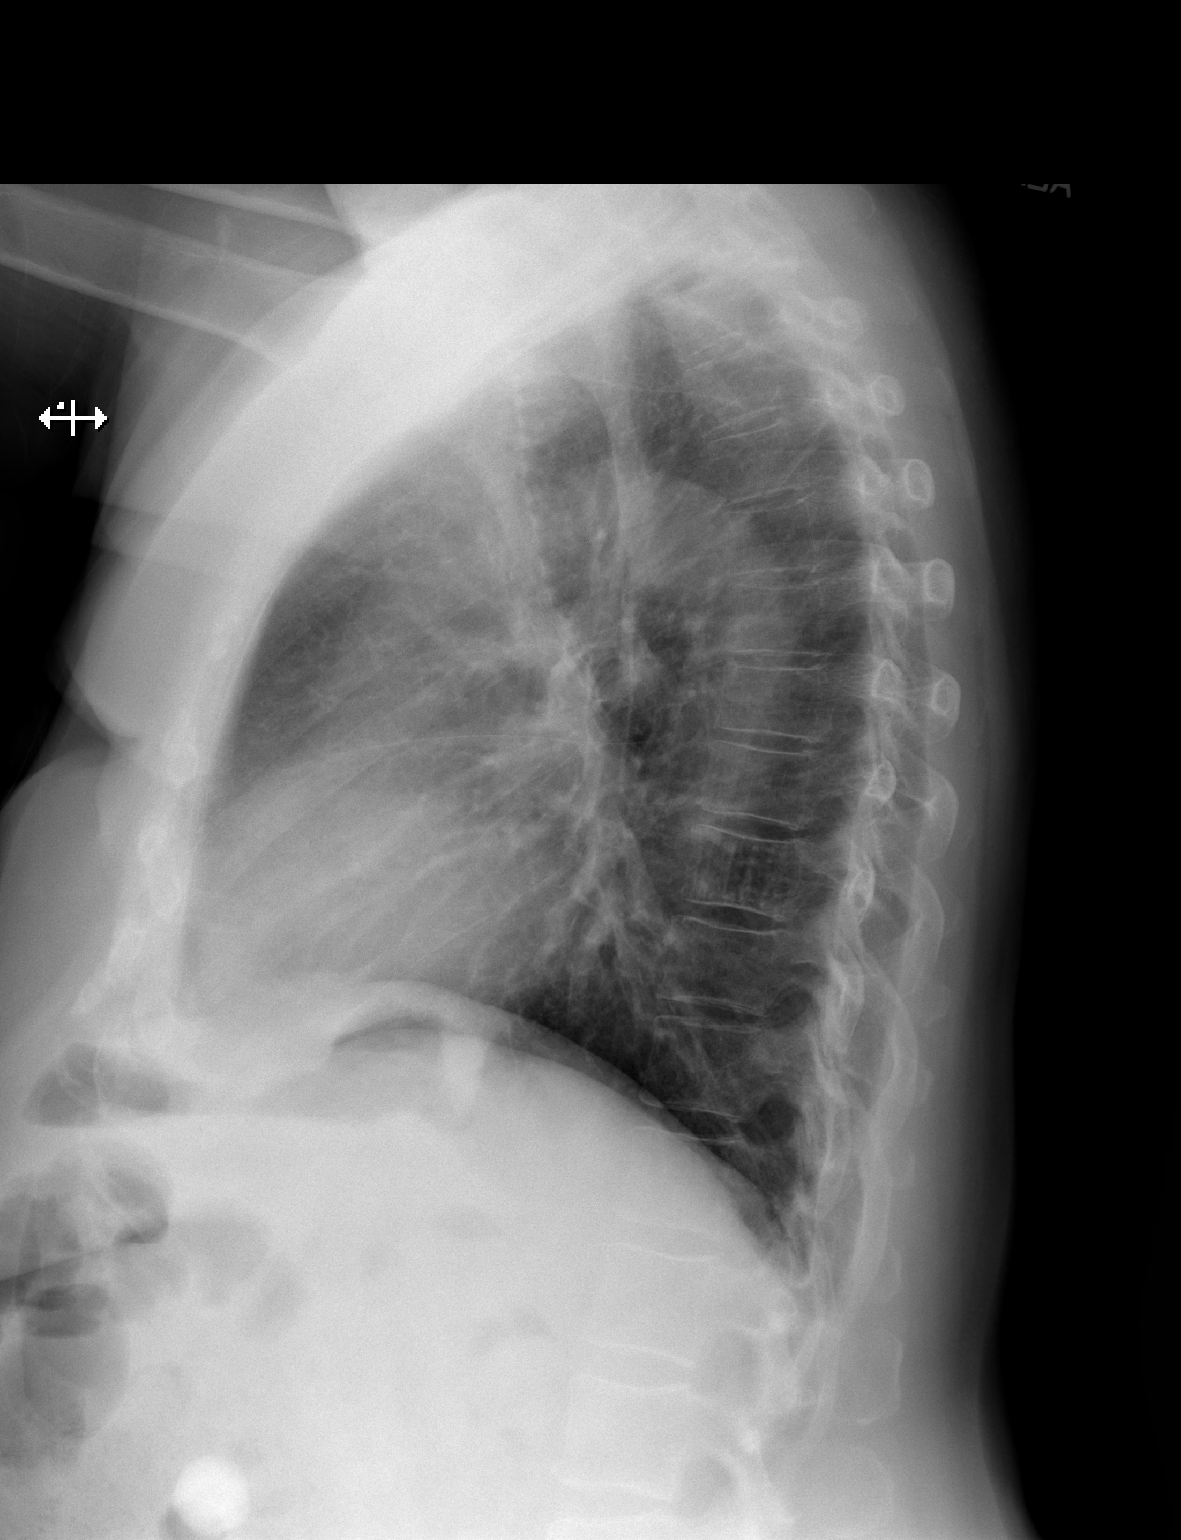

[2 of 2 positions shown; findings below may reference images not displayed]

FINDINGS: Cardiomediastinal contours and hilar structures are normal. Lungs
are clear. No sign of pleural effusion.

On limited assessment no acute skeletal process.
IMPRESSION: No active cardiopulmonary disease.

## 2021-09-19 IMAGING — MG MM DIGITAL SCREENING UNILAT*R* W/ TOMO W/ CAD
4 series · 4 of 12 positions shown · non-contrast
Comparison: Previous exam(s).

CLINICAL DATA: Screening.

EXAM:
DIGITAL SCREENING UNILATERAL RIGHT MAMMOGRAM WITH CAD AND
TOMOSYNTHESIS
TECHNIQUE: Right screening digital craniocaudal and mediolateral oblique
mammograms were obtained. Right screening digital breast
tomosynthesis was performed. The images were evaluated with
computer-aided detection.

[R MLO synth-2D]
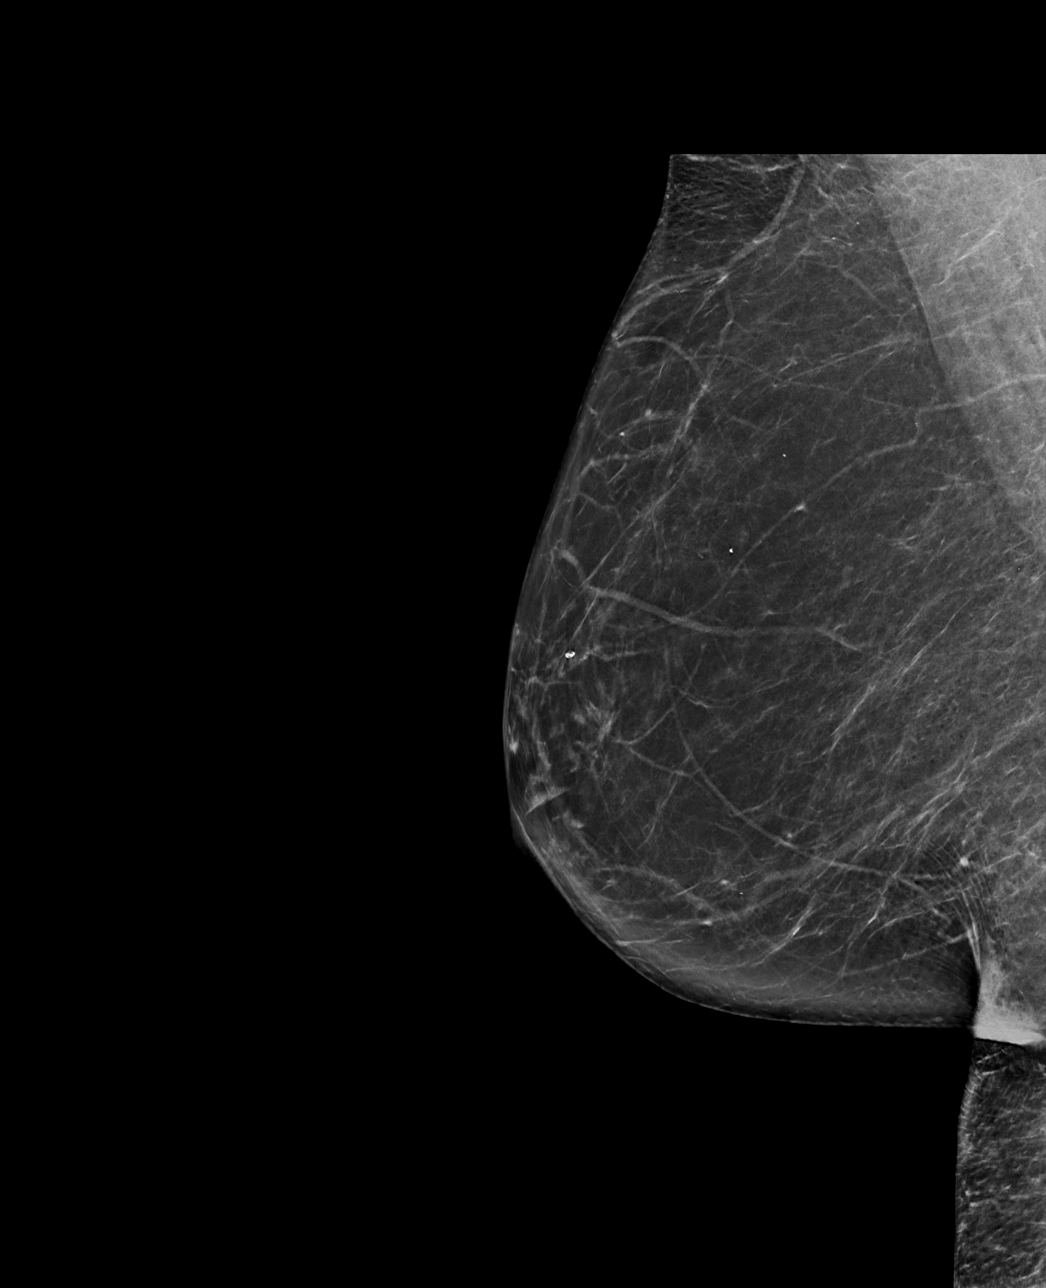

[R CC synth-2D]
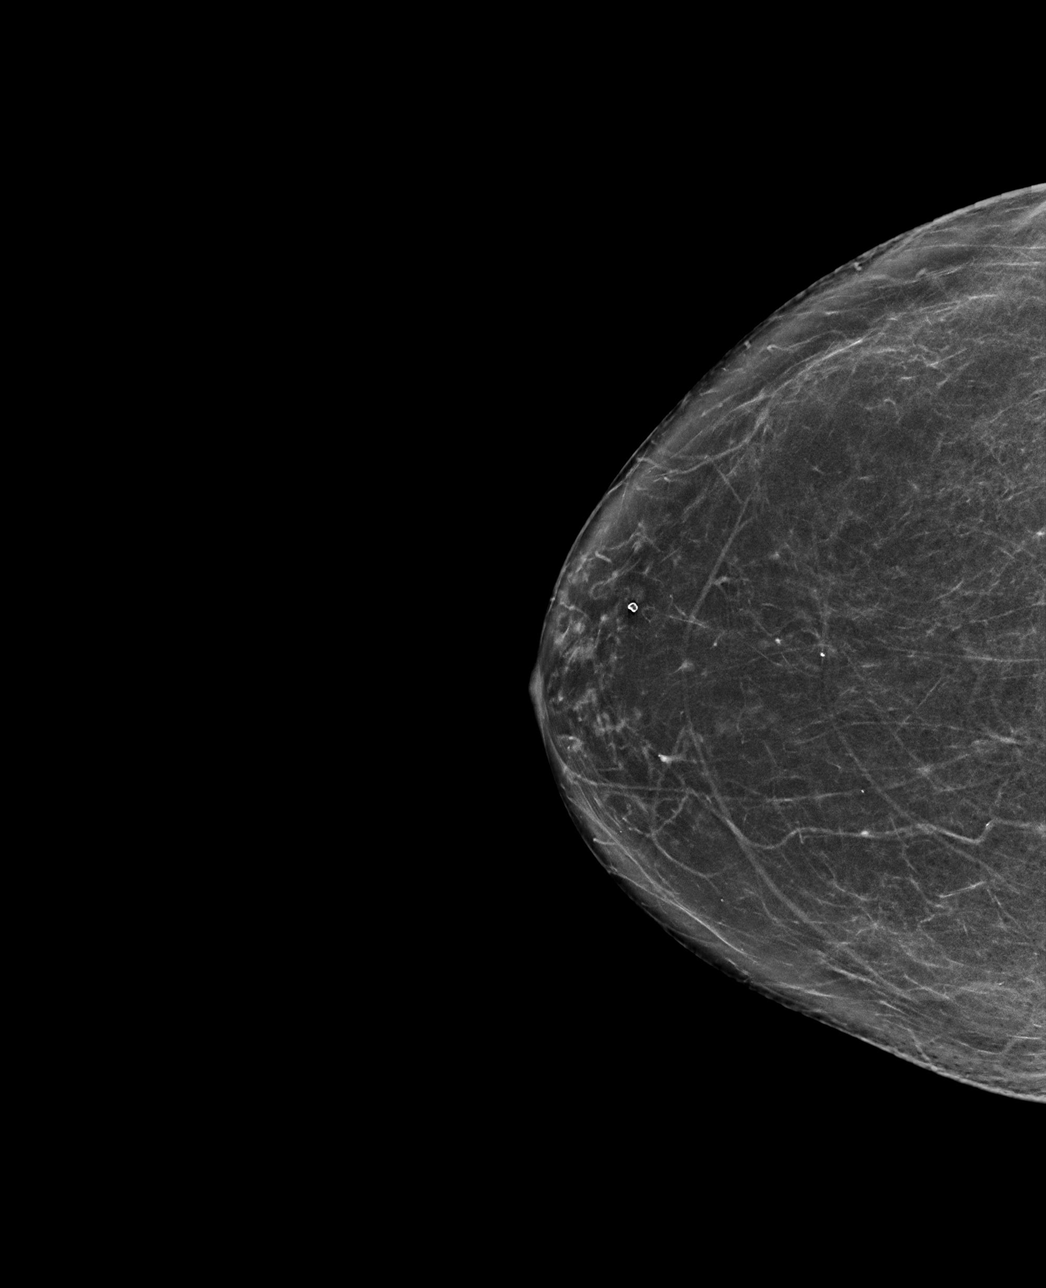

[R CC tomo · tomo slice 35/69.0]
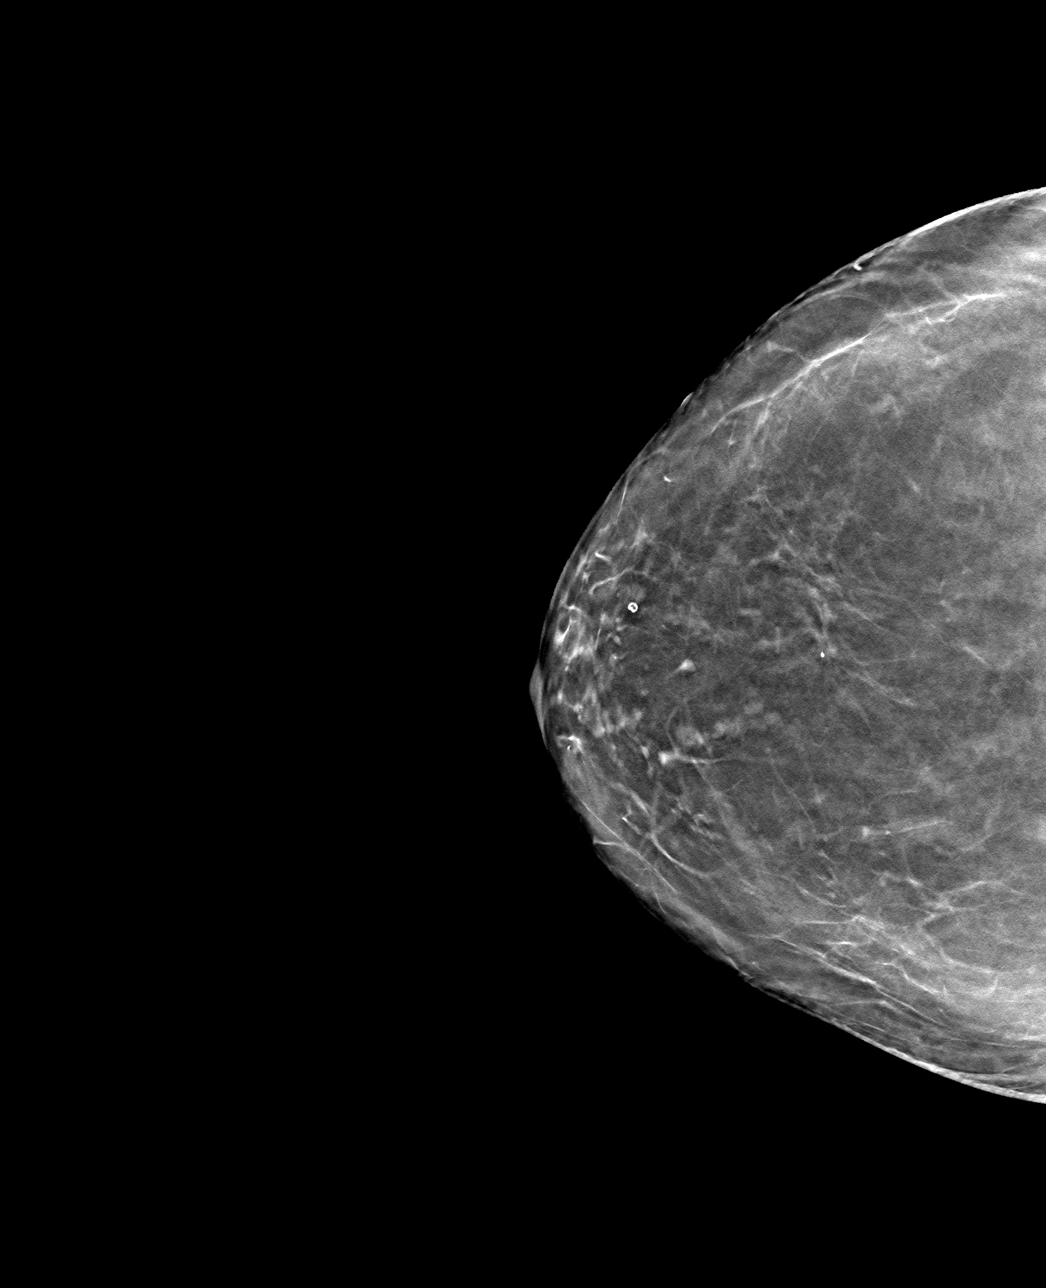

[R MLO tomo · tomo slice 37/74.0]
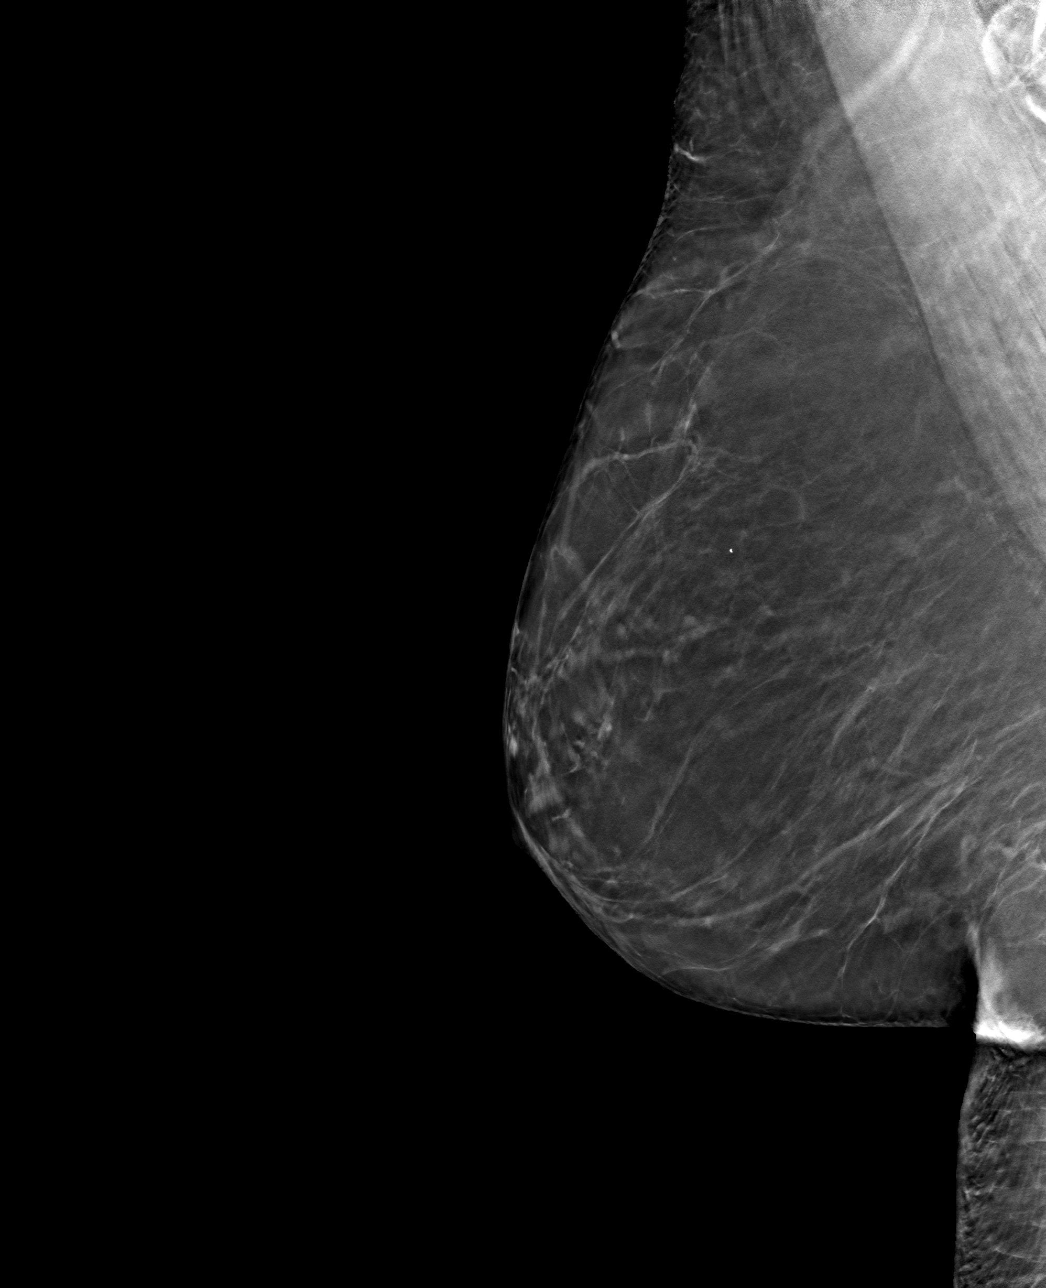

[4 of 12 positions shown; findings below may reference images not displayed]

ACR Breast Density Category b: There are scattered areas of
fibroglandular density.
FINDINGS: There are no findings suspicious for malignancy. The images were
evaluated with computer-aided detection.
IMPRESSION: No mammographic evidence of malignancy. A result letter of this
screening mammogram will be mailed directly to the patient.

RECOMMENDATION:
Screening mammogram in one year. (Code:15-Y-ISD)

BI-RADS CATEGORY  1: Negative.

## 2021-09-30 DIAGNOSIS — H04123 Dry eye syndrome of bilateral lacrimal glands: Secondary | ICD-10-CM | POA: Diagnosis not present

## 2021-09-30 DIAGNOSIS — H524 Presbyopia: Secondary | ICD-10-CM | POA: Diagnosis not present

## 2021-09-30 DIAGNOSIS — G43B0 Ophthalmoplegic migraine, not intractable: Secondary | ICD-10-CM | POA: Diagnosis not present

## 2021-09-30 DIAGNOSIS — H2513 Age-related nuclear cataract, bilateral: Secondary | ICD-10-CM | POA: Diagnosis not present

## 2021-09-30 DIAGNOSIS — H52223 Regular astigmatism, bilateral: Secondary | ICD-10-CM | POA: Diagnosis not present

## 2021-09-30 DIAGNOSIS — H5203 Hypermetropia, bilateral: Secondary | ICD-10-CM | POA: Diagnosis not present

## 2021-10-07 DIAGNOSIS — C50912 Malignant neoplasm of unspecified site of left female breast: Secondary | ICD-10-CM | POA: Diagnosis not present

## 2021-10-20 DIAGNOSIS — H04123 Dry eye syndrome of bilateral lacrimal glands: Secondary | ICD-10-CM | POA: Diagnosis not present

## 2021-12-30 DIAGNOSIS — F5102 Adjustment insomnia: Secondary | ICD-10-CM | POA: Diagnosis not present

## 2021-12-30 DIAGNOSIS — I10 Essential (primary) hypertension: Secondary | ICD-10-CM | POA: Diagnosis not present

## 2021-12-30 DIAGNOSIS — M810 Age-related osteoporosis without current pathological fracture: Secondary | ICD-10-CM | POA: Diagnosis not present

## 2021-12-30 DIAGNOSIS — Z23 Encounter for immunization: Secondary | ICD-10-CM | POA: Diagnosis not present

## 2021-12-30 DIAGNOSIS — K219 Gastro-esophageal reflux disease without esophagitis: Secondary | ICD-10-CM | POA: Diagnosis not present

## 2022-04-21 ENCOUNTER — Other Ambulatory Visit: Payer: Self-pay | Admitting: Family Medicine

## 2022-04-21 DIAGNOSIS — Z1231 Encounter for screening mammogram for malignant neoplasm of breast: Secondary | ICD-10-CM

## 2022-05-04 DIAGNOSIS — G4700 Insomnia, unspecified: Secondary | ICD-10-CM | POA: Diagnosis not present

## 2022-05-04 DIAGNOSIS — F411 Generalized anxiety disorder: Secondary | ICD-10-CM | POA: Diagnosis not present

## 2022-05-04 DIAGNOSIS — Z0000 Encounter for general adult medical examination without abnormal findings: Secondary | ICD-10-CM | POA: Diagnosis not present

## 2022-05-04 DIAGNOSIS — R7303 Prediabetes: Secondary | ICD-10-CM | POA: Diagnosis not present

## 2022-05-04 DIAGNOSIS — K219 Gastro-esophageal reflux disease without esophagitis: Secondary | ICD-10-CM | POA: Diagnosis not present

## 2022-05-04 DIAGNOSIS — M1990 Unspecified osteoarthritis, unspecified site: Secondary | ICD-10-CM | POA: Diagnosis not present

## 2022-05-04 DIAGNOSIS — M810 Age-related osteoporosis without current pathological fracture: Secondary | ICD-10-CM | POA: Diagnosis not present

## 2022-05-04 DIAGNOSIS — M5137 Other intervertebral disc degeneration, lumbosacral region: Secondary | ICD-10-CM | POA: Diagnosis not present

## 2022-05-04 DIAGNOSIS — E7800 Pure hypercholesterolemia, unspecified: Secondary | ICD-10-CM | POA: Diagnosis not present

## 2022-05-04 DIAGNOSIS — R768 Other specified abnormal immunological findings in serum: Secondary | ICD-10-CM | POA: Diagnosis not present

## 2022-05-04 DIAGNOSIS — I10 Essential (primary) hypertension: Secondary | ICD-10-CM | POA: Diagnosis not present

## 2022-05-04 DIAGNOSIS — Z1211 Encounter for screening for malignant neoplasm of colon: Secondary | ICD-10-CM | POA: Diagnosis not present

## 2022-05-10 DIAGNOSIS — Z1211 Encounter for screening for malignant neoplasm of colon: Secondary | ICD-10-CM | POA: Diagnosis not present

## 2022-05-18 LAB — COLOGUARD: COLOGUARD: POSITIVE — AB

## 2022-05-27 DIAGNOSIS — Z1211 Encounter for screening for malignant neoplasm of colon: Secondary | ICD-10-CM | POA: Diagnosis not present

## 2022-05-27 DIAGNOSIS — D125 Benign neoplasm of sigmoid colon: Secondary | ICD-10-CM | POA: Diagnosis not present

## 2022-05-27 DIAGNOSIS — D122 Benign neoplasm of ascending colon: Secondary | ICD-10-CM | POA: Diagnosis not present

## 2022-05-27 DIAGNOSIS — R195 Other fecal abnormalities: Secondary | ICD-10-CM | POA: Diagnosis not present

## 2022-05-27 DIAGNOSIS — D120 Benign neoplasm of cecum: Secondary | ICD-10-CM | POA: Diagnosis not present

## 2022-05-28 DIAGNOSIS — Z86010 Personal history of colonic polyps: Secondary | ICD-10-CM | POA: Insufficient documentation

## 2022-06-04 ENCOUNTER — Ambulatory Visit
Admission: RE | Admit: 2022-06-04 | Discharge: 2022-06-04 | Disposition: A | Discharge status: Home / Self Care | Payer: Medicare HMO | Source: Ambulatory Visit | Attending: Family Medicine | Admitting: Family Medicine

## 2022-06-04 DIAGNOSIS — Z1231 Encounter for screening mammogram for malignant neoplasm of breast: Secondary | ICD-10-CM

## 2022-08-17 DIAGNOSIS — M130 Polyarthritis, unspecified: Secondary | ICD-10-CM | POA: Diagnosis not present

## 2022-08-23 ENCOUNTER — Emergency Department (HOSPITAL_BASED_OUTPATIENT_CLINIC_OR_DEPARTMENT_OTHER)
Admission: EM | Admit: 2022-08-23 | Discharge: 2022-08-23 | Disposition: A | Discharge status: Home / Self Care | Payer: Medicare HMO | Attending: Emergency Medicine | Admitting: Emergency Medicine

## 2022-08-23 ENCOUNTER — Other Ambulatory Visit: Payer: Self-pay

## 2022-08-23 ENCOUNTER — Emergency Department (HOSPITAL_BASED_OUTPATIENT_CLINIC_OR_DEPARTMENT_OTHER): Payer: Medicare HMO

## 2022-08-23 ENCOUNTER — Encounter (HOSPITAL_BASED_OUTPATIENT_CLINIC_OR_DEPARTMENT_OTHER): Payer: Self-pay

## 2022-08-23 DIAGNOSIS — S0012XA Contusion of left eyelid and periocular area, initial encounter: Secondary | ICD-10-CM | POA: Insufficient documentation

## 2022-08-23 DIAGNOSIS — S022XXA Fracture of nasal bones, initial encounter for closed fracture: Secondary | ICD-10-CM | POA: Insufficient documentation

## 2022-08-23 DIAGNOSIS — J3489 Other specified disorders of nose and nasal sinuses: Secondary | ICD-10-CM | POA: Diagnosis not present

## 2022-08-23 DIAGNOSIS — M47812 Spondylosis without myelopathy or radiculopathy, cervical region: Secondary | ICD-10-CM | POA: Diagnosis not present

## 2022-08-23 DIAGNOSIS — M4312 Spondylolisthesis, cervical region: Secondary | ICD-10-CM | POA: Insufficient documentation

## 2022-08-23 DIAGNOSIS — W010XXA Fall on same level from slipping, tripping and stumbling without subsequent striking against object, initial encounter: Secondary | ICD-10-CM | POA: Insufficient documentation

## 2022-08-23 DIAGNOSIS — S0011XA Contusion of right eyelid and periocular area, initial encounter: Secondary | ICD-10-CM | POA: Diagnosis not present

## 2022-08-23 DIAGNOSIS — M50222 Other cervical disc displacement at C5-C6 level: Secondary | ICD-10-CM | POA: Diagnosis not present

## 2022-08-23 DIAGNOSIS — S0083XA Contusion of other part of head, initial encounter: Secondary | ICD-10-CM | POA: Diagnosis present

## 2022-08-23 DIAGNOSIS — R519 Headache, unspecified: Secondary | ICD-10-CM | POA: Diagnosis not present

## 2022-08-23 DIAGNOSIS — W19XXXA Unspecified fall, initial encounter: Secondary | ICD-10-CM

## 2022-08-23 DIAGNOSIS — W0110XA Fall on same level from slipping, tripping and stumbling with subsequent striking against unspecified object, initial encounter: Secondary | ICD-10-CM | POA: Diagnosis not present

## 2022-08-23 DIAGNOSIS — I6782 Cerebral ischemia: Secondary | ICD-10-CM | POA: Insufficient documentation

## 2022-08-23 DIAGNOSIS — M50323 Other cervical disc degeneration at C6-C7 level: Secondary | ICD-10-CM | POA: Diagnosis not present

## 2022-08-23 DIAGNOSIS — S0033XA Contusion of nose, initial encounter: Secondary | ICD-10-CM | POA: Diagnosis not present

## 2022-08-23 DIAGNOSIS — M25531 Pain in right wrist: Secondary | ICD-10-CM | POA: Diagnosis not present

## 2022-08-23 NOTE — ED Provider Notes (Signed)
[place] [facility] Provider Note   CSN: [id number] Arrival date & time: 08/23/22  1107     History  Chief Complaint  Patient presents with   Marletta Lor    Misty Mason is a 77 y.o. female.   Fall  77 year old female presenting for fall.  She states last night she tripped and landed on her face.  No loss of conscious.  No headache or neck pain.  She has swelling to her face.  No nosebleeds.  She was seen in urgent care and sent here for evaluation.  She has no current headache, neck pain, vision changes, weakness, numbness.  No pain to her chest, abdomen, back.  She has some pain to her right arm, states she had an x-ray at urgent care which was unremarkable and placed on removable brace.     Home Medications Prior to Admission medications   Medication Sig Start Date End Date Taking? Authorizing Provider  atorvastatin (LIPITOR) 40 MG tablet Take 40 mg by mouth daily. 03/07/17   [provider]  pantoprazole (PROTONIX) 40 MG tablet Take 1 tablet (40 mg total) by mouth 2 (two) times daily before a meal. 04/10/20 07/09/21  Meryl Dare, MD  traZODone (DESYREL) 100 MG tablet Take 100 mg by mouth at bedtime. 04/09/17   [provider]  valsartan-hydrochlorothiazide (DIOVAN-HCT) 80-12.5 MG tablet Take 1 tablet by mouth daily. 02/18/20   [provider]      Allergies    Patient has no known allergies.    Review of Systems   Review of Systems Review of systems completed and notable as per HPI.  ROS otherwise negative.   Physical Exam Updated Vital Signs BP 117/63   Pulse 67   Temp 98.3 F (36.8 C) (Oral)   Resp 18   SpO2 100%  Physical Exam Vitals and nursing note reviewed.  Constitutional:      General: She is not in acute distress.    Appearance: She is well-developed.  HENT:     Head: Normocephalic.     Comments: Ecchymosis and swelling around the eyes.  No ocular trauma.  No nasal septal hematoma, mild  tenderness on the right aspect of the nose.  No obvious deformity.    Mouth/Throat:     Mouth: Mucous membranes are moist.     Pharynx: Oropharynx is clear.  Eyes:     Conjunctiva/sclera: Conjunctivae normal.  Cardiovascular:     Rate and Rhythm: Normal rate and regular rhythm.     Heart sounds: No murmur heard. Pulmonary:     Effort: Pulmonary effort is normal. No respiratory distress.     Breath sounds: Normal breath sounds.  Abdominal:     Palpations: Abdomen is soft.     Tenderness: There is no abdominal tenderness.  Musculoskeletal:        General: No swelling, tenderness or signs of injury.     Cervical back: Normal range of motion and neck supple. No rigidity or tenderness.     Right lower leg: No edema.     Left lower leg: No edema.  Skin:    General: Skin is warm and dry.     Capillary Refill: Capillary refill takes less than 2 seconds.  Neurological:     General: No focal deficit present.     Mental Status: She is alert and oriented to person, place, and time. Mental status is at baseline.  Psychiatric:        Mood  and Affect: Mood normal.     ED Results / Procedures / Treatments   Labs (all labs ordered are listed, but only abnormal results are displayed) Labs Reviewed - No data to display  EKG None  Radiology CT Head Wo Contrast  Result Date: 08/23/2022 CLINICAL DATA:  Provided history: Fall. EXAM: CT HEAD WITHOUT CONTRAST CT MAXILLOFACIAL WITHOUT CONTRAST CT CERVICAL SPINE WITHOUT CONTRAST TECHNIQUE: Multidetector CT imaging of the head, cervical spine, and maxillofacial structures were performed using the standard protocol without intravenous contrast. Multiplanar CT image reconstructions of the cervical spine and maxillofacial structures were also generated. RADIATION DOSE REDUCTION: This exam was performed according to the departmental dose-optimization program which includes automated exposure control, adjustment of the mA and/or kV according to patient size  and/or use of iterative reconstruction technique. COMPARISON:  None. FINDINGS: CT HEAD FINDINGS Brain: Mild cerebral atrophy. Patchy and ill-defined hypoattenuation within the cerebral white matter, nonspecific but compatible with moderate chronic small vessel ischemic disease. There is no acute intracranial hemorrhage. No demarcated cortical infarct. No extra-axial fluid collection. No evidence of an intracranial mass. No midline shift. Vascular: No hyperdense vessel.  Atherosclerotic calcifications Skull: No calvarial fracture or aggressive osseous lesion. CT MAXILLOFACIAL FINDINGS Osseous: Displaced fracture of the right nasal bone/frontal process of maxilla. No other maxillofacial fracture is identified. Orbits: No acute orbital finding. Sinuses: Minimal mucosal thickening within the left maxillary sinus. Soft tissues: Bilateral maxillofacial soft tissue swelling (right greater than left). CT CERVICAL SPINE FINDINGS Alignment: Nonspecific straightening of the expected cervical lordosis. Slight grade 1 anterolisthesis at C2-C3 and C3-C4. 2 mm grade 1 anterolisthesis at C4-C5. 3 mm grade 1 anterolisthesis at C6-C7 and C7-T1. Slight grade 1 anterolisthesis at T1-T2. Skull base and vertebrae: The basion-dental and atlanto-dental intervals are maintained.No evidence of acute fracture to the cervical spine. Soft tissues and spinal canal: No prevertebral fluid or swelling. No visible canal hematoma. Disc levels: Cervical spondylosis. Disc space narrowing is greatest at C6-C7 (moderate at this level). Shallow disc bulge at C5-C6. Multilevel uncovertebral hypertrophy and facet arthrosis. No appreciable high-grade spinal canal stenosis. Uncovertebral hypertrophy results in right bony neural foraminal narrowing at C5-C6. Upper chest: No consolidation within the imaged lung apices. No visible pneumothorax. Other: 12 mm ovoid cutaneous/subcutaneous cystic appearing lesion within the anterolateral left upper neck (for  instance as seen on series 3, image 42). IMPRESSION: CT head: 1.  No evidence of an acute intracranial abnormality. 2. Moderate chronic small vessel ischemic changes within the cerebral white matter. 3. Mild generalized cerebral atrophy. CT maxillofacial: 1. Displaced fracture of the right nasal bone/frontal process of maxilla. 2. Maxillofacial soft tissue swelling. CT cervical spine: 1. No evidence of an acute cervical spine fracture. 2. Nonspecific straightening of the expected cervical lordosis. 3. Grade 1 anterolisthesis at C2-C3, C3-C4, C4-C5, C6-C7, C7-T1 and T1-T2. 4. Cervical spondylosis as described. 5. Nonspecific cutaneous/subcutaneous cystic-appearing lesion within the anterolateral left upper neck. Direct examination recommended. Electronically Signed   By: Jackey Loge D.O.   On: 08/23/2022 13:11   CT Cervical Spine Wo Contrast  Result Date: 08/23/2022 CLINICAL DATA:  Provided history: Fall. EXAM: CT HEAD WITHOUT CONTRAST CT MAXILLOFACIAL WITHOUT CONTRAST CT CERVICAL SPINE WITHOUT CONTRAST TECHNIQUE: Multidetector CT imaging of the head, cervical spine, and maxillofacial structures were performed using the standard protocol without intravenous contrast. Multiplanar CT image reconstructions of the cervical spine and maxillofacial structures were also generated. RADIATION DOSE REDUCTION: This exam was performed according to the departmental dose-optimization program which includes automated exposure  control, adjustment of the mA and/or kV according to patient size and/or use of iterative reconstruction technique. COMPARISON:  None. FINDINGS: CT HEAD FINDINGS Brain: Mild cerebral atrophy. Patchy and ill-defined hypoattenuation within the cerebral white matter, nonspecific but compatible with moderate chronic small vessel ischemic disease. There is no acute intracranial hemorrhage. No demarcated cortical infarct. No extra-axial fluid collection. No evidence of an intracranial mass. No midline shift.  Vascular: No hyperdense vessel.  Atherosclerotic calcifications Skull: No calvarial fracture or aggressive osseous lesion. CT MAXILLOFACIAL FINDINGS Osseous: Displaced fracture of the right nasal bone/frontal process of maxilla. No other maxillofacial fracture is identified. Orbits: No acute orbital finding. Sinuses: Minimal mucosal thickening within the left maxillary sinus. Soft tissues: Bilateral maxillofacial soft tissue swelling (right greater than left). CT CERVICAL SPINE FINDINGS Alignment: Nonspecific straightening of the expected cervical lordosis. Slight grade 1 anterolisthesis at C2-C3 and C3-C4. 2 mm grade 1 anterolisthesis at C4-C5. 3 mm grade 1 anterolisthesis at C6-C7 and C7-T1. Slight grade 1 anterolisthesis at T1-T2. Skull base and vertebrae: The basion-dental and atlanto-dental intervals are maintained.No evidence of acute fracture to the cervical spine. Soft tissues and spinal canal: No prevertebral fluid or swelling. No visible canal hematoma. Disc levels: Cervical spondylosis. Disc space narrowing is greatest at C6-C7 (moderate at this level). Shallow disc bulge at C5-C6. Multilevel uncovertebral hypertrophy and facet arthrosis. No appreciable high-grade spinal canal stenosis. Uncovertebral hypertrophy results in right bony neural foraminal narrowing at C5-C6. Upper chest: No consolidation within the imaged lung apices. No visible pneumothorax. Other: 12 mm ovoid cutaneous/subcutaneous cystic appearing lesion within the anterolateral left upper neck (for instance as seen on series 3, image 42). IMPRESSION: CT head: 1.  No evidence of an acute intracranial abnormality. 2. Moderate chronic small vessel ischemic changes within the cerebral white matter. 3. Mild generalized cerebral atrophy. CT maxillofacial: 1. Displaced fracture of the right nasal bone/frontal process of maxilla. 2. Maxillofacial soft tissue swelling. CT cervical spine: 1. No evidence of an acute cervical spine fracture. 2.  Nonspecific straightening of the expected cervical lordosis. 3. Grade 1 anterolisthesis at C2-C3, C3-C4, C4-C5, C6-C7, C7-T1 and T1-T2. 4. Cervical spondylosis as described. 5. Nonspecific cutaneous/subcutaneous cystic-appearing lesion within the anterolateral left upper neck. Direct examination recommended. Electronically Signed   By: Jackey Loge D.O.   On: 08/23/2022 13:11   CT Maxillofacial Wo Contrast  Result Date: 08/23/2022 CLINICAL DATA:  Provided history: Fall. EXAM: CT HEAD WITHOUT CONTRAST CT MAXILLOFACIAL WITHOUT CONTRAST CT CERVICAL SPINE WITHOUT CONTRAST TECHNIQUE: Multidetector CT imaging of the head, cervical spine, and maxillofacial structures were performed using the standard protocol without intravenous contrast. Multiplanar CT image reconstructions of the cervical spine and maxillofacial structures were also generated. RADIATION DOSE REDUCTION: This exam was performed according to the departmental dose-optimization program which includes automated exposure control, adjustment of the mA and/or kV according to patient size and/or use of iterative reconstruction technique. COMPARISON:  None. FINDINGS: CT HEAD FINDINGS Brain: Mild cerebral atrophy. Patchy and ill-defined hypoattenuation within the cerebral white matter, nonspecific but compatible with moderate chronic small vessel ischemic disease. There is no acute intracranial hemorrhage. No demarcated cortical infarct. No extra-axial fluid collection. No evidence of an intracranial mass. No midline shift. Vascular: No hyperdense vessel.  Atherosclerotic calcifications Skull: No calvarial fracture or aggressive osseous lesion. CT MAXILLOFACIAL FINDINGS Osseous: Displaced fracture of the right nasal bone/frontal process of maxilla. No other maxillofacial fracture is identified. Orbits: No acute orbital finding. Sinuses: Minimal mucosal thickening within the left maxillary sinus. Soft tissues:  Bilateral maxillofacial soft tissue swelling (right  greater than left). CT CERVICAL SPINE FINDINGS Alignment: Nonspecific straightening of the expected cervical lordosis. Slight grade 1 anterolisthesis at C2-C3 and C3-C4. 2 mm grade 1 anterolisthesis at C4-C5. 3 mm grade 1 anterolisthesis at C6-C7 and C7-T1. Slight grade 1 anterolisthesis at T1-T2. Skull base and vertebrae: The basion-dental and atlanto-dental intervals are maintained.No evidence of acute fracture to the cervical spine. Soft tissues and spinal canal: No prevertebral fluid or swelling. No visible canal hematoma. Disc levels: Cervical spondylosis. Disc space narrowing is greatest at C6-C7 (moderate at this level). Shallow disc bulge at C5-C6. Multilevel uncovertebral hypertrophy and facet arthrosis. No appreciable high-grade spinal canal stenosis. Uncovertebral hypertrophy results in right bony neural foraminal narrowing at C5-C6. Upper chest: No consolidation within the imaged lung apices. No visible pneumothorax. Other: 12 mm ovoid cutaneous/subcutaneous cystic appearing lesion within the anterolateral left upper neck (for instance as seen on series 3, image 42). IMPRESSION: CT head: 1.  No evidence of an acute intracranial abnormality. 2. Moderate chronic small vessel ischemic changes within the cerebral white matter. 3. Mild generalized cerebral atrophy. CT maxillofacial: 1. Displaced fracture of the right nasal bone/frontal process of maxilla. 2. Maxillofacial soft tissue swelling. CT cervical spine: 1. No evidence of an acute cervical spine fracture. 2. Nonspecific straightening of the expected cervical lordosis. 3. Grade 1 anterolisthesis at C2-C3, C3-C4, C4-C5, C6-C7, C7-T1 and T1-T2. 4. Cervical spondylosis as described. 5. Nonspecific cutaneous/subcutaneous cystic-appearing lesion within the anterolateral left upper neck. Direct examination recommended. Electronically Signed   By: Jackey Loge D.O.   On: 08/23/2022 13:11    Procedures Procedures    Medications Ordered in ED Medications  - No data to display  ED Course/ Medical Decision Making/ A&P                             Medical Decision Making Amount and/or Complexity of Data Reviewed Radiology: ordered.   Medical Decision Making:   Misty Mason is a 77 y.o. female who presented to the ED today with mechanical fall.  All signs reviewed.  On exam she has mild bruising and swelling to the face.  No evidence of dental trauma.  No signs of nasal septal hematoma.  Will plan to obtain CT scans.  Given no presyncope or syncope, no indication for lab workup at this time.   Patient placed on continuous vitals and telemetry monitoring while in ED which was reviewed periodically.  Reviewed and confirmed nursing documentation for past medical history, family history, social history.  Initial Study Results:    Radiology:  All images reviewed independently.  Right-sided nasal bone fracture agree with radiology report at this time.    Reassessment and Plan:   CT scan notable for mildly displaced right-sided nasal bone fracture.  No external deformity.  She is no nasal septal hematoma.  Shows no evidence of intracranial bleeding, skull fracture, or cervical spine fracture.  Given this I think she is stable for outpatient follow-up with ENT and PCP.  She is comfortable to plan.  Discharged in stable condition.  Return precautions given.   Patient's presentation is most consistent with acute presentation with potential threat to life or bodily function.           Final Clinical Impression(s) / ED Diagnoses Final diagnoses:  Fall, initial encounter    Rx / DC Orders ED Discharge Orders     None  Laurence Spates, MD 08/23/22 [id number]

## 2022-08-23 NOTE — ED Triage Notes (Signed)
Patient presents to ED via POV from urgent care. Patient reports fall last night. Patient tripped and "face planted" in her driveway. Sent for a CT face. Bilateral bruising noted to eyes. Denies LOC. Not on blood thinners. Ambulatory. GCS 15.

## 2022-08-23 NOTE — Discharge Instructions (Addendum)
Your CT scan shows a right-sided nasal fracture.  You need to call the number above to schedule follow-up with ENT.  You should also follow-up with your PCP.  If you develop severe pain or persistent nosebleed you should return to the ED.

## 2022-08-23 NOTE — ED Notes (Signed)
Discharge instructions reviewed with patient. Patient verbalizes understanding, no further questions at this time. Medications/prescriptions and follow up information provided. No acute distress noted at time of departure.  

## 2022-09-03 ENCOUNTER — Encounter (INDEPENDENT_AMBULATORY_CARE_PROVIDER_SITE_OTHER): Payer: Self-pay | Admitting: Otolaryngology

## 2022-09-03 ENCOUNTER — Ambulatory Visit (INDEPENDENT_AMBULATORY_CARE_PROVIDER_SITE_OTHER): Payer: Medicare HMO | Admitting: Otolaryngology

## 2022-09-03 VITALS — BP 126/68 | HR 71 | Ht 62.0 in | Wt 132.0 lb

## 2022-09-03 DIAGNOSIS — S022XXA Fracture of nasal bones, initial encounter for closed fracture: Secondary | ICD-10-CM | POA: Diagnosis not present

## 2022-09-03 DIAGNOSIS — S0993XA Unspecified injury of face, initial encounter: Secondary | ICD-10-CM

## 2022-09-03 DIAGNOSIS — W19XXXA Unspecified fall, initial encounter: Secondary | ICD-10-CM

## 2022-09-03 NOTE — Progress Notes (Signed)
ENT CONSULT:  Reason for Consult: nasal bone fracture  HPI:  58 yoF seen in our office for evaluation of nasal bone fracture. The patient reports that she struck her face on the ground after a mechanical fall on 08/21/21. She did not lose consciousness or suffer any other injuries from the fall. She had a brief episode of epistaxis that resolve on its on.  She followed up in the emergency room on 08/23/2022 and had CT scans of her head, cervical spine, and max/face. The only reported acute finding on CT images was a displaced fracture of the right nasal bone/frontal process of maxilla.   Records Reviewed:   ER physician note on 08/23/2022  "Reassessment and Plan:   CT scan notable for mildly displaced right-sided nasal bone fracture.  No external deformity.  She is no nasal septal hematoma.  Shows no evidence of intracranial bleeding, skull fracture, or cervical spine fracture.  Given this I think she is stable for outpatient follow-up with ENT and PCP.  She is comfortable to plan.  Discharged in stable condition.  Return precautions given."   CT max/face: 08/25/2022 1. Displaced fracture of the right nasal bone/frontal process of maxilla. 2. Maxillofacial soft tissue swelling.   Past Medical History:  Diagnosis Date   Arthritis    Back pain    Breast cancer (HCC) 2009   GERD (gastroesophageal reflux disease)    High cholesterol    Hypertension    Osteoporosis     Past Surgical History:  Procedure Laterality Date   ABDOMINAL HYSTERECTOMY     COLONOSCOPY     MASTECTOMY Left 11/2007   WRIST SURGERY      Family History  Problem Relation Age of Onset   Heart attack Sister    Colon cancer Neg Hx    Esophageal cancer Neg Hx    Pancreatic cancer Neg Hx    Stomach cancer Neg Hx     Social History:  reports that she has never smoked. She has never used smokeless tobacco. She reports current alcohol use. She reports that she does not use drugs.  Allergies: No Known  Allergies  Medications: I have reviewed the patient's current medications.  The PMH, PSH, Medications, Allergies, and SH were reviewed and updated.  ROS: Constitutional: Negative for fever, weight loss and weight gain. Cardiovascular: Negative for chest pain and dyspnea on exertion. Respiratory: Is not experiencing shortness of breath at rest. Gastrointestinal: Negative for nausea and vomiting. Neurological: Negative for headaches. Psychiatric: The patient is not nervous/anxious  There were no vitals taken for this visit.  PHYSICAL EXAM:  Exam: General: Well-developed, well-nourished Communication and Voice: Clear pitch and clarity Respiratory Respiratory effort: Equal inspiration and expiration without stridor Cardiovascular Peripheral Vascular: Warm extremities with equal color/perfusion Eyes: No nystagmus with equal extraocular motion bilaterally Neuro/Psych/Balance: Patient oriented to person, place, and time; Appropriate mood and affect; Gait is intact with no imbalance; Cranial nerves I-XII are intact Head and Face Inspection: Normocephalic and atraumatic without mass or lesion Palpation: Facial skeleton intact without bony stepoffs, minimal ecchymosis along b/l cheeks Facial Strength: Facial motility symmetric and full bilaterally ENT Pinna: External ear intact and fully developed External canal: Canal is patent with intact skin Tympanic Membrane: Clear and mobile, no hemotympanum External Nose: No scar or anatomic deformity Internal Nose: Septum is minimally deviated on anterior rhinoscopy, no epistaxis no septal hematoma. No polyp, or purulence. Mucosal edema and erythema present.  Bilateral inferior turbinate hypertrophy.  TMJ: No pain to palpation with  full mobility Oral cavity/oropharynx: No erythema or exudate, no lesions present    Procedure: none      Studies Reviewed:  CT maxillofacial: 08/23/2022   1. Displaced fracture of the right nasal  bone/frontal process of maxilla. 2. Maxillofacial soft tissue swelling.   Encounter Diagnoses  Name Primary?   Facial injury, initial encounter Yes   Closed fracture of nasal bone, initial encounter    A/P:   77 year old female with history of recent fall, and evidence of nondisplaced nasal bone fractures on imaging in ED. she denies nasal deformity, had mild epistaxis initially but none since the trauma on 08/23/22.  She is happy with the appearance of her nose after the initial swelling is down.  No evidence of other facial bone fractures on imaging.  No septal hematoma on exam and no step-offs along the nasal bones.   No ENT interventions warranted I discussed exam findings with the patient and the plan to observe.  Thank you for allowing me to participate in the care of this patient. Please do not hesitate to contact me with any questions or concerns.   Ashok Croon, MD Otolaryngology [facility] Phone: [phone] Fax: [phone]    09/03/2022, 11:25 AM

## 2022-09-03 NOTE — Progress Notes (Deleted)
ENT CONSULT:  Reason for Consult: nasal fracture  HPI:  This is a 50 YOF seen in our office for evaluation of nasal fracture. The patient reports that she struck her face on the ground after a mechanical fall on 08/21/21. She did not lose consciousness or suffer any other injuries from the fall. She had a brief episode of epistaxis that resolve on its on.  She followed up in the emergency room on 08/23/2022 and had CT scans of her head, cervical spine, and maxillofacial. The only reported acute finding on CT images was a displaced fracture of the right nasal bone/frontal process of maxilla.   Ileene Allie is an 77 y.o. female with hx   Records Reviewed:   ER physician note on 08/23/2022  "Reassessment and Plan:   CT scan notable for mildly displaced right-sided nasal bone fracture.  No external deformity.  She is no nasal septal hematoma.  Shows no evidence of intracranial bleeding, skull fracture, or cervical spine fracture.  Given this I think she is stable for outpatient follow-up with ENT and PCP.  She is comfortable to plan.  Discharged in stable condition.  Return precautions given."   CT maxillofacial: 08/25/2022   1. Displaced fracture of the right nasal bone/frontal process of maxilla. 2. Maxillofacial soft tissue swelling.     Past Medical History:  Diagnosis Date   Arthritis    Back pain    Breast cancer (HCC) 2009   GERD (gastroesophageal reflux disease)    High cholesterol    Hypertension    Osteoporosis     Past Surgical History:  Procedure Laterality Date   ABDOMINAL HYSTERECTOMY     COLONOSCOPY     MASTECTOMY Left 11/2007   WRIST SURGERY      Family History  Problem Relation Age of Onset   Heart attack Sister    Colon cancer Neg Hx    Esophageal cancer Neg Hx    Pancreatic cancer Neg Hx    Stomach cancer Neg Hx     Social History:  reports that she has never smoked. She has never used smokeless tobacco. She reports current alcohol use. She reports  that she does not use drugs.  Allergies: No Known Allergies  Medications: I have reviewed the patient's current medications.  The PMH, PSH, Medications, Allergies, and SH were reviewed and updated.  ROS: Constitutional: Negative for fever, weight loss and weight gain. Cardiovascular: Negative for chest pain and dyspnea on exertion. Respiratory: Is not experiencing shortness of breath at rest. Gastrointestinal: Negative for nausea and vomiting. Neurological: Negative for headaches. Psychiatric: The patient is not nervous/anxious  There were no vitals taken for this visit.  PHYSICAL EXAM:  Exam: General: Well-developed, well-nourished Communication and Voice: Clear pitch and clarity Respiratory Respiratory effort: Equal inspiration and expiration without stridor Cardiovascular Peripheral Vascular: Warm extremities with equal color/perfusion Eyes: No nystagmus with equal extraocular motion bilaterally Neuro/Psych/Balance: Patient oriented to person, place, and time; Appropriate mood and affect; Gait is intact with no imbalance; Cranial nerves I-XII are intact Head and Face Inspection: Normocephalic and atraumatic without mass or lesion Palpation: Facial skeleton intact without bony stepoffs Salivary Glands: No mass or tenderness Facial Strength: Facial motility symmetric and full bilaterally ENT Pinna: External ear intact and fully developed External canal: Canal is patent with intact skin Tympanic Membrane: Clear and mobile External Nose: No scar or anatomic deformity Internal Nose: Septum is ***. No polyp, or purulence. Mucosal edema and erythema present.  Bilateral inferior turbinate hypertrophy.  Lips, Teeth, and gums: Mucosa and teeth intact and viable TMJ: No pain to palpation with full mobility Oral cavity/oropharynx: No erythema or exudate, no lesions present Nasopharynx: No mass or lesion with intact mucosa Hypopharynx: Intact mucosa without pooling of  secretions Larynx Glottic: Full true vocal cord mobility without lesion or mass Supraglottic: Normal appearing epiglottis and AE folds Interarytenoid Space: No or minimal pachydermia or edema Subglottic Space: Patent without lesion or edema Neck Neck and Trachea: Midline trachea without mass or lesion Thyroid: No mass or nodularity Lymphatics: No lymphadenopathy  Procedure:      Studies Reviewed:  CT maxillofacial: 08/23/2022   1. Displaced fracture of the right nasal bone/frontal process of maxilla. 2. Maxillofacial soft tissue swelling.   Assessment/Plan: No diagnosis found.  ***  Thank you for allowing me to participate in the care of this patient. Please do not hesitate to contact me with any questions or concerns.   Ashok Croon, MD Otolaryngology [facility] Phone: [phone] Fax: [phone]    09/03/2022, 11:25 AM

## 2022-09-16 DIAGNOSIS — Z8249 Family history of ischemic heart disease and other diseases of the circulatory system: Secondary | ICD-10-CM | POA: Diagnosis not present

## 2022-09-16 DIAGNOSIS — Z008 Encounter for other general examination: Secondary | ICD-10-CM | POA: Diagnosis not present

## 2022-09-16 DIAGNOSIS — G4700 Insomnia, unspecified: Secondary | ICD-10-CM | POA: Diagnosis not present

## 2022-09-16 DIAGNOSIS — Z853 Personal history of malignant neoplasm of breast: Secondary | ICD-10-CM | POA: Diagnosis not present

## 2022-09-16 DIAGNOSIS — K5900 Constipation, unspecified: Secondary | ICD-10-CM | POA: Diagnosis not present

## 2022-09-16 DIAGNOSIS — Z833 Family history of diabetes mellitus: Secondary | ICD-10-CM | POA: Diagnosis not present

## 2022-09-16 DIAGNOSIS — M4800 Spinal stenosis, site unspecified: Secondary | ICD-10-CM | POA: Diagnosis not present

## 2022-09-16 DIAGNOSIS — K219 Gastro-esophageal reflux disease without esophagitis: Secondary | ICD-10-CM | POA: Diagnosis not present

## 2022-09-16 DIAGNOSIS — I10 Essential (primary) hypertension: Secondary | ICD-10-CM | POA: Diagnosis not present

## 2022-09-16 DIAGNOSIS — F419 Anxiety disorder, unspecified: Secondary | ICD-10-CM | POA: Diagnosis not present

## 2022-09-16 DIAGNOSIS — E785 Hyperlipidemia, unspecified: Secondary | ICD-10-CM | POA: Diagnosis not present

## 2022-09-16 DIAGNOSIS — M160 Bilateral primary osteoarthritis of hip: Secondary | ICD-10-CM | POA: Diagnosis not present

## 2022-10-06 DIAGNOSIS — C50912 Malignant neoplasm of unspecified site of left female breast: Secondary | ICD-10-CM | POA: Diagnosis not present

## 2022-10-07 ENCOUNTER — Encounter: Payer: Self-pay | Admitting: Family Medicine

## 2022-10-07 ENCOUNTER — Ambulatory Visit (INDEPENDENT_AMBULATORY_CARE_PROVIDER_SITE_OTHER): Payer: Medicare HMO | Admitting: Family Medicine

## 2022-10-07 VITALS — BP 124/72 | HR 69 | Temp 97.7°F | Ht 62.0 in | Wt 135.4 lb

## 2022-10-07 DIAGNOSIS — E782 Mixed hyperlipidemia: Secondary | ICD-10-CM

## 2022-10-07 DIAGNOSIS — I10 Essential (primary) hypertension: Secondary | ICD-10-CM | POA: Diagnosis not present

## 2022-10-07 DIAGNOSIS — F411 Generalized anxiety disorder: Secondary | ICD-10-CM | POA: Diagnosis not present

## 2022-10-07 DIAGNOSIS — L723 Sebaceous cyst: Secondary | ICD-10-CM

## 2022-10-07 DIAGNOSIS — K8020 Calculus of gallbladder without cholecystitis without obstruction: Secondary | ICD-10-CM | POA: Insufficient documentation

## 2022-10-07 DIAGNOSIS — K219 Gastro-esophageal reflux disease without esophagitis: Secondary | ICD-10-CM | POA: Insufficient documentation

## 2022-10-07 DIAGNOSIS — M810 Age-related osteoporosis without current pathological fracture: Secondary | ICD-10-CM

## 2022-10-07 DIAGNOSIS — F5101 Primary insomnia: Secondary | ICD-10-CM

## 2022-10-07 DIAGNOSIS — Z23 Encounter for immunization: Secondary | ICD-10-CM

## 2022-10-07 DIAGNOSIS — M19041 Primary osteoarthritis, right hand: Secondary | ICD-10-CM | POA: Insufficient documentation

## 2022-10-07 DIAGNOSIS — R7303 Prediabetes: Secondary | ICD-10-CM | POA: Insufficient documentation

## 2022-10-07 MED ORDER — VENLAFAXINE HCL ER 37.5 MG PO CP24: 37.5000 mg | ORAL_CAPSULE | Freq: Every day | ORAL | 1 refills | Status: DC

## 2022-10-07 NOTE — Assessment & Plan Note (Signed)
Continue trazodone 100 mg at bedtime

## 2022-10-07 NOTE — Assessment & Plan Note (Signed)
I discourage use of a BZD for management of anxiety. I recommend we try her on venlafaxine. I will see her back in 6 weeks.

## 2022-10-07 NOTE — Assessment & Plan Note (Signed)
Continue atorvastatin 40 mg daily.  

## 2022-10-07 NOTE — Progress Notes (Signed)
[facility] [address] [place] [address] Dept: [phone] Dept Fax: [phone]  New Patient Office Visit  Subjective:    Patient ID: Lyndal Rainbow, female    DOB: July 05, 1945, 77 y.o..   MRN: [id number]  Chief Complaint  Patient presents with   Establish Care    NP-establish care.  Wants to discuss CT scans done 4-6 weeks ago.     History of Present Illness:  Patient is in today to establish care. Ms. Abramson was born in [place]. She attended some college at [employer]. She became a CMA. 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 She lived in [place] for 30 years, but returned to [place] 10 years ago to care for her aging mother. She worked for [employer] for 30 years, primarily in Airline pilot. She has been married for 40 years. Her husband currently suffers from dementia, which has been a considerable stressor for her. Ms. Jennison has two children, a daughter (66) and a son (6). She also has 5 grandchildren. She denies tobacco or drug use, but does drink two glasses of wine a day.  Ms. Boettner has hypertension. She lost 40 lbs in the past year, so feels this is doing much better. She is managed on valsartan 80 mg daily.  Ms. Santizo has hyperlipidemia. She is managed on atorvastatin 40 mg daily.  Ms. Hade has osteoporosis. She is managed on alendronate 70 mg weekly.  Ms. Schrotenboer has a history of generalized anxiety disorder. The issues with her husband's health has been a trigger for this. she notes friends have asked her about why she is not on Xanax. She was tried on Lexapro, but did not like the potential side effects, and felt it was not effective, so eventually tapered off of this. she also does have some insomnia issues and is on trazodone 100 mg at bedtime.  Ms. Lafrance has had prior prediabetes. She was on Ozempic for a time, but her insurance is no longer covering this.  Ms.  Tarr has GERD. She uses famotidine 40 mg bid as needed.  Ms. 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 She has had bilateral hip pain. A rheumatologist recently told her she is having some hip bursitis issues. 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  Ms. Kasparian had a fall in July. She had CT scans of the brain, face, and neck. She had a right nasal bone fracture. She had an incidental finding of a mass int he anterolateral left neck that appeared cystic and to be dermal or subdermal. Ms. Torello notes she has had a nodule there for the past 6 years. She was previously able to squeeze a white substance out of this. She had seen a  dermatologist at one point, who felt this was not of concern. However, Ms. Ollivierre would like this removed.  Past Medical History: Patient Active Problem List   Diagnosis Date Noted   History of colonic polyps 05/28/2022   Insomnia 05/04/2022   GAD (generalized anxiety disorder) 05/04/2022   DDD (degenerative disc disease), lumbosacral 10/04/2019   Sensorineural hearing loss (SNHL) of both ears 11/06/2018   Benign paroxysmal positional vertigo of right ear 11/06/2018   Hyperlipidemia 05/05/2017   History of breast cancer 01/23/2013   Lumbar spondylosis 11/17/2010   Essential hypertension 11/17/2010   Past Surgical History:  Procedure Laterality Date   ABDOMINAL HYSTERECTOMY  COLONOSCOPY     MASTECTOMY Left 11/2007   WRIST SURGERY     Family History  Problem Relation Age of Onset   Heart attack Sister    Heart disease Paternal Aunt    Heart disease Paternal Uncle    Colon cancer Neg Hx    Esophageal cancer Neg Hx    Pancreatic cancer Neg Hx    Stomach cancer Neg Hx    Outpatient Medications Prior to Visit  Medication Sig Dispense Refill   alendronate (FOSAMAX) 70 MG tablet Take 70 mg by mouth once a week.     ascorbic acid (VITAMIN C) 500 MG tablet Take 500 mg by  mouth daily.     atorvastatin (LIPITOR) 40 MG tablet Take 40 mg by mouth daily.     diclofenac Sodium (VOLTAREN) 1 % GEL Apply topically.     famotidine (PEPCID) 40 MG tablet Take by mouth.     glucose blood (ONETOUCH ULTRA) test strip 1 each as needed.     Lancets (ONETOUCH ULTRASOFT) lancets 1 each by Other route as needed.     oxyCODONE-acetaminophen (PERCOCET/ROXICET) 5-325 MG tablet Take 1 tablet by mouth every 8 (eight) hours as needed.     traZODone (DESYREL) 100 MG tablet Take 100 mg by mouth at bedtime.     valsartan (DIOVAN) 80 MG tablet Take 80 mg by mouth daily.     pantoprazole (PROTONIX) 40 MG tablet Take 1 tablet (40 mg total) by mouth 2 (two) times daily before a meal. 60 tablet 11   valsartan-hydrochlorothiazide (DIOVAN-HCT) 80-12.5 MG tablet Take 1 tablet by mouth daily.     No facility-administered medications prior to visit.   No Known Allergies Objective:   Today's Vitals   10/07/22 0852  BP: 124/72  Pulse: 69  Temp: 97.7 F (36.5 C)  TempSrc: Temporal  SpO2: 100%  Weight: 135 lb 6.4 oz (61.4 kg)  Height: 5\' 2"  (1.575 m)   Body mass index is 24.76 kg/m.   General: Well developed, well nourished. No acute distress. Neck: Supple. No lymphadenopathy. There is a solid nodule int he left anterior neck measuring   about 1 cm in size. No sign of redness. This appears to be in the skin. Psych: Alert and oriented. Normal mood and affect.  Health Maintenance Due  Topic Date Due   Hepatitis C Screening  Never done   DTaP/Tdap/Td (1 - Tdap) Never done   Zoster Vaccines- Shingrix (1 of 2) Never done   Pneumonia Vaccine 77+ Years old (2 of 2 - PCV) 06/13/2012   INFLUENZA VACCINE  09/02/2022     Assessment & Plan:   Problem List Items Addressed This Visit       Cardiovascular and Mediastinum   Essential hypertension - Primary    Blood pressure is in good control. Continue valsartan 80 mg daily.      Relevant Medications   valsartan (DIOVAN) 80 MG tablet      Musculoskeletal and Integument   Osteoporosis    Review of old records after the appointment shows she was started on alendronate around 09/2017. At this point, she has completed 5 tears of therapy. At her next visit, we will discuss stopping this medicine. I also recommend we refer her for a repeat bone density.      Relevant Medications   alendronate (FOSAMAX) 70 MG tablet   Sebaceous cyst- Left neck    By history, this appears to be a sebaceous cyst. Due to it's location and proximity  to other neurovascular structures, I recommend referral to plastic surgery for cyst removal.      Relevant Orders   Ambulatory referral to Plastic Surgery     Other   GAD (generalized anxiety disorder)    I discourage use of a BZD for management of anxiety. I recommend we try her on venlafaxine. I will see her back in 6 weeks.      Relevant Medications   venlafaxine XR (EFFEXOR XR) 37.5 MG 24 hr capsule   Hyperlipidemia    Continue atorvastatin 40 mg daily.      Relevant Medications   valsartan (DIOVAN) 80 MG tablet   Insomnia    Continue trazodone 100 mg at bedtime.      Other Visit Diagnoses     Need for immunization against influenza       Relevant Orders   Flu Vaccine Trivalent High Dose (Fluad) (Completed)       Return in about 6 weeks (around 11/18/2022) for Reassessment.   Loyola Mast, MD

## 2022-10-07 NOTE — Assessment & Plan Note (Signed)
By history, this appears to be a sebaceous cyst. Due to it's location and proximity to other neurovascular structures, I recommend referral to plastic surgery for cyst removal.

## 2022-10-07 NOTE — Assessment & Plan Note (Signed)
Review of old records after the appointment shows she was started on alendronate around 09/2017. At this point, she has completed 5 tears of therapy. At her next visit, we will discuss stopping this medicine. I also recommend we refer her for a repeat bone density.

## 2022-10-07 NOTE — Assessment & Plan Note (Signed)
Blood pressure is in good control. Continue valsartan 80 mg daily.

## 2022-11-01 ENCOUNTER — Other Ambulatory Visit: Payer: Self-pay | Admitting: Family Medicine

## 2022-11-02 ENCOUNTER — Telehealth: Payer: Self-pay | Admitting: Family Medicine

## 2022-11-02 DIAGNOSIS — R69 Illness, unspecified: Secondary | ICD-10-CM | POA: Diagnosis not present

## 2022-11-02 NOTE — Telephone Encounter (Signed)
Pt wants [name] to know she is tolerating her venlafaxine XR (EFFEXOR-XR) 37.5 MG 24 hr capsule [[id number] well. She wants to know if the dosage should be increased? I let her know it's at her pharmacy, she still would like a cb concerning this at [phone]

## 2022-11-02 NOTE — Telephone Encounter (Signed)
Read last OV note and there was not mention to increase the dose but to follow up in 6 weeks. Appt scheduled already on 11/18/22.  Patient notified VIA phone. Dm/cma

## 2022-11-04 ENCOUNTER — Ambulatory Visit: Payer: Medicare HMO | Admitting: Plastic Surgery

## 2022-11-04 ENCOUNTER — Encounter: Payer: Self-pay | Admitting: Plastic Surgery

## 2022-11-04 VITALS — BP 114/66 | HR 74 | Ht 62.0 in | Wt 134.0 lb

## 2022-11-04 DIAGNOSIS — L989 Disorder of the skin and subcutaneous tissue, unspecified: Secondary | ICD-10-CM

## 2022-11-04 DIAGNOSIS — R69 Illness, unspecified: Secondary | ICD-10-CM | POA: Diagnosis not present

## 2022-11-04 NOTE — Progress Notes (Signed)
Referring Provider Rudd, Bertram Millard, MD [address] [place],  [place] [address]   CC:  Chief Complaint  Patient presents with   Advice Only      Misty Mason is an 77 y.o. female.  HPI: Misty Mason is a 77 year old female who presents today with a 3 to 4-year history of a mass in the skin just inferior to the left mandibular border.  She states that this occasionally drains especially if she squeezes it and the material that comes out is a thick white material.  Incidentally she sustained a fall several weeks ago and had a CT scan of the neck for her C-spine.  The lesion was seen on the CT scan and is consistent with an epidermal inclusion cyst.  No Known Allergies  Outpatient Encounter Medications as of 11/04/2022  Medication Sig   alendronate (FOSAMAX) 70 MG tablet Take 70 mg by mouth once a week.   ascorbic acid (VITAMIN C) 500 MG tablet Take 500 mg by mouth daily.   atorvastatin (LIPITOR) 40 MG tablet Take 40 mg by mouth daily.   diclofenac Sodium (VOLTAREN) 1 % GEL Apply topically.   glucose blood (ONETOUCH ULTRA) test strip 1 each as needed.   Lancets (ONETOUCH ULTRASOFT) lancets 1 each by Other route as needed.   traZODone (DESYREL) 100 MG tablet Take 100 mg by mouth at bedtime.   valsartan (DIOVAN) 80 MG tablet Take 80 mg by mouth daily.   venlafaxine XR (EFFEXOR-XR) 37.5 MG 24 hr capsule TAKE 1 CAPSULE BY MOUTH DAILY WITH BREAKFAST.   famotidine (PEPCID) 40 MG tablet Take by mouth.   [DISCONTINUED] oxyCODONE-acetaminophen (PERCOCET/ROXICET) 5-325 MG tablet Take 1 tablet by mouth every 8 (eight) hours as needed. (Patient not taking: Reported on 11/04/2022)   No facility-administered encounter medications on file as of 11/04/2022.     Past Medical History:  Diagnosis Date   Arthritis    Back pain    Breast cancer (HCC) 2009   GERD (gastroesophageal reflux disease)    High cholesterol    Hypertension    Osteoporosis     Past Surgical History:  Procedure  Laterality Date   ABDOMINAL HYSTERECTOMY     COLONOSCOPY     MASTECTOMY Left 11/2007   WRIST SURGERY      Family History  Problem Relation Age of Onset   Heart attack Sister    Heart disease Paternal Aunt    Heart disease Paternal Uncle    Colon cancer Neg Hx    Esophageal cancer Neg Hx    Pancreatic cancer Neg Hx    Stomach cancer Neg Hx     Social History   Social History Narrative   Not on file     Review of Systems General: Denies fevers, chills, weight loss CV: Denies chest pain, shortness of breath, palpitations Skin: Small subcutaneous nodule in the upper portion of the neck just below the left mandibular border.  Patient states that there is occasional drainage from this and that it currently is not causing her any discomfort.  Physical Exam    11/04/2022   11:03 AM 10/07/2022    8:52 AM 09/03/2022   11:30 AM  Vitals with BMI  Height 5\' 2"  5\' 2"  5\' 2"   Weight 134 lbs 135 lbs 6 oz 132 lbs  BMI 24.5 24.76 24.14  Systolic 114 124 213  Diastolic 66 72 68  Pulse 74 69 71    General:  No acute distress,  Alert and oriented,  Non-Toxic, Normal speech and affect Integument: Patient has a 1.5 cm mass just below the left mandibular border consistent with an epidermal inclusion cyst.  There is no erythema pain or induration.  There is a classic pit over the cyst. Mammogram: Primary care physician has documented the patient had a mammogram in 2022 however there are no results available.  Will offer to obtain a mammogram if she wishes. Assessment/Plan Cyst left upper neck: This is likely a sebaceous or epidermal inclusion cyst.  Can easily remove this in the office under local anesthetic.  Discussed the procedure with the patient including the importance of removing the entire cyst wall and the possibility that it can recur.  Photographs were obtained today with her consent.  Will schedule her for removal of the cyst in the office.  Santiago Glad 11/04/2022, 11:34 AM

## 2022-11-09 DIAGNOSIS — R69 Illness, unspecified: Secondary | ICD-10-CM | POA: Diagnosis not present

## 2022-11-11 DIAGNOSIS — R69 Illness, unspecified: Secondary | ICD-10-CM | POA: Diagnosis not present

## 2022-11-16 DIAGNOSIS — R69 Illness, unspecified: Secondary | ICD-10-CM | POA: Diagnosis not present

## 2022-11-16 DIAGNOSIS — C50912 Malignant neoplasm of unspecified site of left female breast: Secondary | ICD-10-CM | POA: Diagnosis not present

## 2022-11-18 ENCOUNTER — Ambulatory Visit: Payer: Medicare HMO | Admitting: Family Medicine

## 2022-11-18 ENCOUNTER — Encounter: Payer: Self-pay | Admitting: Family Medicine

## 2022-11-18 VITALS — BP 132/70 | HR 73 | Temp 97.8°F | Ht 62.0 in | Wt 133.8 lb

## 2022-11-18 DIAGNOSIS — I10 Essential (primary) hypertension: Secondary | ICD-10-CM | POA: Diagnosis not present

## 2022-11-18 DIAGNOSIS — F411 Generalized anxiety disorder: Secondary | ICD-10-CM | POA: Diagnosis not present

## 2022-11-18 DIAGNOSIS — E538 Deficiency of other specified B group vitamins: Secondary | ICD-10-CM | POA: Diagnosis not present

## 2022-11-18 DIAGNOSIS — E782 Mixed hyperlipidemia: Secondary | ICD-10-CM

## 2022-11-18 DIAGNOSIS — Z23 Encounter for immunization: Secondary | ICD-10-CM | POA: Diagnosis not present

## 2022-11-18 DIAGNOSIS — R69 Illness, unspecified: Secondary | ICD-10-CM | POA: Diagnosis not present

## 2022-11-18 DIAGNOSIS — M810 Age-related osteoporosis without current pathological fracture: Secondary | ICD-10-CM | POA: Diagnosis not present

## 2022-11-18 DIAGNOSIS — R7303 Prediabetes: Secondary | ICD-10-CM

## 2022-11-18 LAB — BASIC METABOLIC PANEL
BUN: 17 mg/dL (ref 6–23)
CO2: 29 meq/L (ref 19–32)
Calcium: 9.7 mg/dL (ref 8.4–10.5)
Chloride: 99 meq/L (ref 96–112)
Creatinine, Ser: 0.58 mg/dL (ref 0.40–1.20)
GFR: 86.75 mL/min (ref >60.00)
Glucose, Bld: 96 mg/dL (ref 70–99)
Potassium: 4.6 meq/L (ref 3.5–5.1)
Sodium: 136 meq/L (ref 135–145)

## 2022-11-18 LAB — LIPID PANEL
Cholesterol: 155 mg/dL (ref 0–200)
HDL: 62.5 mg/dL (ref >39.00)
LDL Cholesterol: 77 mg/dL (ref 0–99)
NonHDL: 92.27
Total CHOL/HDL Ratio: 2
Triglycerides: 74 mg/dL (ref 0.0–149.0)
VLDL: 14.8 mg/dL (ref 0.0–40.0)

## 2022-11-18 LAB — VITAMIN B12: Vitamin B-12: 1174 pg/mL — ABNORMAL HIGH (ref 211–911)

## 2022-11-18 LAB — MAGNESIUM: Magnesium: 2.1 mg/dL (ref 1.5–2.5)

## 2022-11-18 MED ORDER — VENLAFAXINE HCL ER 75 MG PO CP24
75.0000 mg | ORAL_CAPSULE | Freq: Every day | ORAL | 5 refills | Status: DC
Start: 2022-11-18 — End: 2022-12-16

## 2022-11-18 NOTE — Assessment & Plan Note (Signed)
Blood pressure is in good control. Continue valsartan 80 mg daily.

## 2022-11-18 NOTE — Assessment & Plan Note (Signed)
I discourage use of a BZD for management of anxiety. I recommend we increase her venlafaxine to 75 mg daily. I will see her back in 6 weeks.

## 2022-11-18 NOTE — Assessment & Plan Note (Signed)
As Misty Mason has completed 5 years of alendronate, I recommend she stop this. It has been over 2 years since her last bone density test. I will order this for reassessment.

## 2022-11-18 NOTE — Progress Notes (Signed)
[facility] [address] [place] [address] Dept: [phone] Dept Fax: [phone]  Chronic Care Office Visit  Subjective:    Patient ID: Krystalyn Kubota, female    DOB: 25-Apr-1945, 77 y.o..   MRN: [id number]  Chief Complaint  Patient presents with   Follow-up    F/u anxiety.     History of Present Illness:  Patient is in today for reassessment of chronic medical issues.  Ms. Tuch has hypertension. She is managed on valsartan 80 mg daily.   Ms. Bookbinder has hyperlipidemia. She is managed on atorvastatin 40 mg daily.   Ms. Tapscott has osteoporosis. She is managed on alendronate 70 mg weekly. Review of records indicates that this was started in August 2019, so she has completed 5 years of therapy at this point.   Ms. Cost has a history of generalized anxiety disorder. The issues with her husband's health has been a trigger for this. At her last visit, we started venlafaxine 37.5 mg daily. She is not sure that this has been particularly helpful, though she is tolerating the medicine well. She asks again about Xanax.   Ms. Schaad has had prior prediabetes. She was on Ozempic for a time, but her insurance is no longer covering this.  Past Medical History: Patient Active Problem List   Diagnosis Date Noted   Osteoporosis 10/07/2022   GERD (gastroesophageal reflux disease) 10/07/2022   Prediabetes 10/07/2022   Cholelithiasis 10/07/2022   Osteoarthritis of hands, bilateral 10/07/2022   Sebaceous cyst- Left neck 10/07/2022   History of colonic polyps 05/28/2022   Insomnia 05/04/2022   GAD (generalized anxiety disorder) 05/04/2022   DDD (degenerative disc disease), lumbosacral 10/04/2019   Sensorineural hearing loss (SNHL) of both ears 11/06/2018   Benign paroxysmal positional vertigo of right ear 11/06/2018   Hyperlipidemia 05/05/2017   History of breast cancer 01/23/2013   Lumbar spondylosis 11/17/2010    Essential hypertension 11/17/2010   Past Surgical History:  Procedure Laterality Date   ABDOMINAL HYSTERECTOMY     COLONOSCOPY     MASTECTOMY Left 11/2007   WRIST SURGERY     Family History  Problem Relation Age of Onset   Heart attack Sister    Heart disease Paternal Aunt    Heart disease Paternal Uncle    Colon cancer Neg Hx    Esophageal cancer Neg Hx    Pancreatic cancer Neg Hx    Stomach cancer Neg Hx    Outpatient Medications Prior to Visit  Medication Sig Dispense Refill   ascorbic acid (VITAMIN C) 500 MG tablet Take 500 mg by mouth daily.     atorvastatin (LIPITOR) 40 MG tablet Take 40 mg by mouth daily.     diclofenac Sodium (VOLTAREN) 1 % GEL Apply topically.     famotidine (PEPCID) 40 MG tablet Take by mouth.     glucose blood (ONETOUCH ULTRA) test strip 1 each as needed.     Lancets (ONETOUCH ULTRASOFT) lancets 1 each by Other route as needed.     traZODone (DESYREL) 100 MG tablet Take 100 mg by mouth at bedtime.     valsartan (DIOVAN) 80 MG tablet Take 80 mg by mouth daily.     alendronate (FOSAMAX) 70 MG tablet Take 70 mg by mouth once a week.     venlafaxine XR (EFFEXOR-XR) 37.5 MG 24 hr capsule TAKE 1 CAPSULE BY MOUTH DAILY WITH BREAKFAST. 90 capsule 1   No facility-administered medications prior to visit.   No  Known Allergies Objective:   Today's Vitals   11/18/22 0956  BP: 132/70  Pulse: 73  Temp: 97.8 F (36.6 C)  TempSrc: Temporal  SpO2: 100%  Weight: 133 lb 12.8 oz (60.7 kg)  Height: 5\' 2"  (1.575 m)   Body mass index is 24.47 kg/m.   General: Well developed, well nourished. No acute distress. Psych: Alert and oriented. Normal mood and affect.  Health Maintenance Due  Topic Date Due   Hepatitis C Screening  Never done   DTaP/Tdap/Td (1 - Tdap) Never done   Zoster Vaccines- Shingrix (1 of 2) Never done   Pneumonia Vaccine 5+ Years old (2 of 2 - PCV) 06/13/2012     Assessment & Plan:   Problem List Items Addressed This Visit        Cardiovascular and Mediastinum   Essential hypertension - Primary    Blood pressure is in good control. Continue valsartan 80 mg daily.      Relevant Orders   Basic metabolic panel   Magnesium     Musculoskeletal and Integument   Osteoporosis    As Ms. Corvin has completed 5 years of alendronate, I recommend she stop this. It has been over 2 years since her last bone density test. I will order this for reassessment.      Relevant Orders   DG Bone Density     Other   GAD (generalized anxiety disorder)    I discourage use of a BZD for management of anxiety. I recommend we increase her venlafaxine to 75 mg daily. I will see her back in 6 weeks.      Relevant Medications   venlafaxine XR (EFFEXOR-XR) 75 MG 24 hr capsule   Hyperlipidemia    I will check lipids today. Continue atorvastatin 40 mg daily.      Relevant Orders   Lipid panel   Prediabetes    I will do annual screening of blood sugar.      Relevant Orders   Basic metabolic panel   Vitamin B12 deficiency    By history, has a low Vitamin B12. Takes a supplement 5 days per week. I will reassess her level today.      Relevant Orders   Vitamin B12   Other Visit Diagnoses     Need for pneumococcal 20-valent conjugate vaccination       Relevant Orders   Pneumococcal conjugate vaccine 20-valent (Completed)       Return in about 6 weeks (around 12/30/2022) for Reassessment.   Loyola Mast, MD

## 2022-11-18 NOTE — Assessment & Plan Note (Signed)
I will do annual screening of blood sugar.

## 2022-11-18 NOTE — Assessment & Plan Note (Signed)
I will check lipids today. Continue atorvastatin 40 mg daily.

## 2022-11-18 NOTE — Assessment & Plan Note (Signed)
By history, has a low Vitamin B12. Takes a supplement 5 days per week. I will reassess her level today.

## 2022-11-23 DIAGNOSIS — R69 Illness, unspecified: Secondary | ICD-10-CM | POA: Diagnosis not present

## 2022-11-25 ENCOUNTER — Ambulatory Visit: Payer: Medicare HMO | Admitting: Plastic Surgery

## 2022-11-25 VITALS — BP 137/64 | HR 67

## 2022-11-25 DIAGNOSIS — L720 Epidermal cyst: Secondary | ICD-10-CM

## 2022-11-25 DIAGNOSIS — R69 Illness, unspecified: Secondary | ICD-10-CM | POA: Diagnosis not present

## 2022-11-25 DIAGNOSIS — L989 Disorder of the skin and subcutaneous tissue, unspecified: Secondary | ICD-10-CM

## 2022-11-25 NOTE — Progress Notes (Signed)
Procedure Note  Preoperative Dx: Subcutaneous mass inferior to the left mandibular border  Postoperative Dx: Same  Procedure: Excision of subcutaneous mass  Anesthesia: Lidocaine 1% with 1:100,000 epinephrine and 0.25% Sensorcaine   Indication for Procedure: Removal for pathologic diagnosis  Description of Procedure: Risks and complications were explained to the patient including the possibility of recurrence..  Consent was confirmed and the patient understands the risks and benefits.  The potential complications and alternatives were explained and the patient consents.  The patient expressed understanding the option of not having the procedure and the risks of a scar.  Time out was called and all information was confirmed to be correct.    The area was prepped and drapped.  Local anesthetic was injected in the subcutaneous tissues.  After waiting for the local to take affect a 2 cm incision was made over the mass.  A combination of sharp and blunt dissection were used to isolate the cyst which was clearly full of sebum.  The mass was excised removing the entire cyst wall.  After obtaining hemostasis, the surgical wound was closed with 4-0 Monocryl sutures in the deep layers and dermis and interrupted 5-0 Prolene sutures and the skin.  The surgical wound measured 2 cm.  A dressing was applied.  The patient was given instructions on how to care for the area and a follow up appointment.  Misty Mason tolerated the procedure well and there were no complications. The specimen was sent to pathology.

## 2022-11-26 LAB — DERMATOLOGY PATHOLOGY

## 2022-11-30 DIAGNOSIS — R69 Illness, unspecified: Secondary | ICD-10-CM | POA: Diagnosis not present

## 2022-12-01 ENCOUNTER — Ambulatory Visit (INDEPENDENT_AMBULATORY_CARE_PROVIDER_SITE_OTHER): Payer: Medicare HMO | Admitting: Physician Assistant

## 2022-12-01 ENCOUNTER — Encounter: Payer: Self-pay | Admitting: Physician Assistant

## 2022-12-01 VITALS — BP 128/73 | HR 69 | Ht 62.5 in | Wt 133.0 lb

## 2022-12-01 DIAGNOSIS — L720 Epidermal cyst: Secondary | ICD-10-CM

## 2022-12-01 NOTE — Progress Notes (Signed)
Patient is a pleasant 77 year old female s/p excision of subcutaneous mass inferior to the left mandibular border performed 11/25/2022 by Dr. Ladona Ridgel who returns to clinic for postprocedural follow-up.  Reviewed the procedure note and the 2 cm incision was closed with interrupted Prolene sutures.  The specimen was sent to pathology and consistent with an epidermal inclusion cyst.  Today, patient is doing well.  No specific complaints.  Band-Aid in place.  She reports that she reviewed her pathology report and is pleased with benign findings.  Mild itching, but suspects due to healing.  She feels prepared for suture removal.  On exam, horizontal excision site underneath the left mandible appears intact, well-healed.  A total of 5 simple interrupted Prolene sutures are removed without complication or difficulty.  No wound dehiscence or drainage.  No surrounding erythema or irritation.  Mild firmness underneath the excision site.  No crepitus or fluctuance.  Suspect that the mild firmness is likely due to the buried sutures versus small hematoma versus inflammation versus scar tissue formation.  Either way, suspect that it will improve with gentle mechanical massage.  Recommend that she begin massaging the area next week.  Once daily Vaseline x 7 days and then she can transition to silicone scar gels.  No specific follow-up needed, but she can certainly call the clinic should you have any additional questions or concerns.  Picture(s) obtained of the patient and placed in the chart were with the patient's or guardian's permission.

## 2022-12-02 DIAGNOSIS — R69 Illness, unspecified: Secondary | ICD-10-CM | POA: Diagnosis not present

## 2022-12-07 DIAGNOSIS — R69 Illness, unspecified: Secondary | ICD-10-CM | POA: Diagnosis not present

## 2022-12-09 DIAGNOSIS — R69 Illness, unspecified: Secondary | ICD-10-CM | POA: Diagnosis not present

## 2022-12-14 DIAGNOSIS — R69 Illness, unspecified: Secondary | ICD-10-CM | POA: Diagnosis not present

## 2022-12-16 ENCOUNTER — Other Ambulatory Visit: Payer: Self-pay | Admitting: Family Medicine

## 2022-12-16 DIAGNOSIS — F411 Generalized anxiety disorder: Secondary | ICD-10-CM

## 2022-12-21 DIAGNOSIS — R69 Illness, unspecified: Secondary | ICD-10-CM | POA: Diagnosis not present

## 2022-12-27 ENCOUNTER — Telehealth: Payer: Self-pay | Admitting: Family Medicine

## 2022-12-27 NOTE — Telephone Encounter (Signed)
Derric/ [employer] [phone]  Asking if you received  afax form them that needs a signature.

## 2022-12-28 DIAGNOSIS — R69 Illness, unspecified: Secondary | ICD-10-CM | POA: Diagnosis not present

## 2022-12-28 NOTE — Telephone Encounter (Signed)
Called and advised that we didn't receive it and they ar re-faxing it to [place]. Dm/cma

## 2023-01-05 ENCOUNTER — Telehealth: Payer: Self-pay | Admitting: Family Medicine

## 2023-01-05 NOTE — Telephone Encounter (Signed)
Received form.  Will place on providers desk. Dm/cma

## 2023-01-05 NOTE — Telephone Encounter (Signed)
[employer] / Ladene Artist [phone] Fax [phone] Ref # [id number]  They want to know the status of a fax they sent on last Tuesday.

## 2023-01-05 NOTE — Telephone Encounter (Signed)
Form was signed/faxed back to them @ [phone].  Dm/cma

## 2023-01-06 ENCOUNTER — Encounter: Payer: Self-pay | Admitting: Family Medicine

## 2023-01-06 ENCOUNTER — Ambulatory Visit: Payer: Medicare HMO | Admitting: Family Medicine

## 2023-01-06 VITALS — BP 118/68 | HR 69 | Temp 98.8°F | Ht 62.5 in | Wt 138.8 lb

## 2023-01-06 DIAGNOSIS — F5101 Primary insomnia: Secondary | ICD-10-CM | POA: Diagnosis not present

## 2023-01-06 DIAGNOSIS — M19042 Primary osteoarthritis, left hand: Secondary | ICD-10-CM

## 2023-01-06 DIAGNOSIS — K219 Gastro-esophageal reflux disease without esophagitis: Secondary | ICD-10-CM

## 2023-01-06 DIAGNOSIS — I10 Essential (primary) hypertension: Secondary | ICD-10-CM | POA: Diagnosis not present

## 2023-01-06 DIAGNOSIS — E782 Mixed hyperlipidemia: Secondary | ICD-10-CM | POA: Diagnosis not present

## 2023-01-06 DIAGNOSIS — M19041 Primary osteoarthritis, right hand: Secondary | ICD-10-CM | POA: Diagnosis not present

## 2023-01-06 DIAGNOSIS — R7303 Prediabetes: Secondary | ICD-10-CM

## 2023-01-06 DIAGNOSIS — L853 Xerosis cutis: Secondary | ICD-10-CM | POA: Diagnosis not present

## 2023-01-06 DIAGNOSIS — F411 Generalized anxiety disorder: Secondary | ICD-10-CM

## 2023-01-06 DIAGNOSIS — K5900 Constipation, unspecified: Secondary | ICD-10-CM

## 2023-01-06 LAB — T4, FREE: Free T4: 1.82 ng/dL — ABNORMAL HIGH (ref 0.60–1.60)

## 2023-01-06 LAB — TSH: TSH: 0.64 u[IU]/mL (ref 0.35–5.50)

## 2023-01-06 MED ORDER — FAMOTIDINE 40 MG PO TABS
40.0000 mg | ORAL_TABLET | Freq: Every day | ORAL | 3 refills | Status: AC
Start: 2023-01-06 — End: ?

## 2023-01-06 MED ORDER — ONETOUCH ULTRA VI STRP
1.0000 | ORAL_STRIP | 2 refills | Status: DC | PRN
Start: 2023-01-06 — End: 2023-10-05

## 2023-01-06 MED ORDER — TRAZODONE HCL 100 MG PO TABS
100.0000 mg | ORAL_TABLET | Freq: Every day | ORAL | 3 refills | Status: DC
Start: 2023-01-06 — End: 2023-12-21

## 2023-01-06 MED ORDER — VALSARTAN 80 MG PO TABS
80.0000 mg | ORAL_TABLET | Freq: Every day | ORAL | 3 refills | Status: DC
Start: 2023-01-06 — End: 2023-12-21

## 2023-01-06 MED ORDER — ONETOUCH ULTRASOFT LANCETS MISC
1.0000 | 2 refills | Status: AC | PRN
Start: 2023-01-06 — End: ?

## 2023-01-06 MED ORDER — ATORVASTATIN CALCIUM 20 MG PO TABS
20.0000 mg | ORAL_TABLET | Freq: Every day | ORAL | 3 refills | Status: DC
Start: 2023-01-06 — End: 2023-12-21

## 2023-01-06 MED ORDER — DICLOFENAC SODIUM 1 % EX GEL
2.0000 g | Freq: Four times a day (QID) | CUTANEOUS | 3 refills | Status: DC | PRN
Start: 2023-01-06 — End: 2023-04-15

## 2023-01-06 NOTE — Assessment & Plan Note (Signed)
I feel Misty Mason is having a positive response to venlafaxine, though I sense she prefers to use Xanax. BZDs are not consider first-line options. I will continue venlafaxine 75 mg daily.

## 2023-01-06 NOTE — Assessment & Plan Note (Signed)
I will give her a trial of atorvastatin 20 mg and plan to reassess her lipids in 3 months.

## 2023-01-06 NOTE — Assessment & Plan Note (Signed)
I discussed whether it is beneficial for Misty Mason to continue to check her blood sugars at home. She admits that she doesn't necessarily make significant changes based on results. I feel she cold stop checking at home and we could just follow her A1c. She seems hesitant to give this up.

## 2023-01-06 NOTE — Assessment & Plan Note (Signed)
Continue trazodone 100 mg at bedtime

## 2023-01-06 NOTE — Assessment & Plan Note (Signed)
Stable. Continue Voltaren gel as needed.

## 2023-01-06 NOTE — Assessment & Plan Note (Signed)
Blood pressure is in good control. Continue valsartan 80 mg daily.

## 2023-01-06 NOTE — Assessment & Plan Note (Signed)
Stable. Continue famotidine 40 mg bid.

## 2023-01-06 NOTE — Progress Notes (Signed)
[facility] [address] [place] [address] Dept: [phone] Dept Fax: [phone]  Chronic Care Office Visit  Subjective:    Patient ID: Misty Mason, female    DOB: March 08, 1945, 77 y.o..   MRN: [id number]  Chief Complaint  Patient presents with   Hypertension    6 week f/u HTN.   Average BP at home 130/75 -132/80   History of Present Illness:  Patient is in today for reassessment of chronic medical issues.   Misty Mason has hypertension. She is managed on valsartan 80 mg daily.   Misty Mason has hyperlipidemia. She is managed on atorvastatin 40 mg daily. She notes that she isn't sure that she needs to be on such a high dose. She would like to try reducing this to 20 mg daily.   Misty Mason has a history of generalized anxiety disorder. The issues with her husband's health has been a trigger for this. This summer, we started venlafaxine 37.5 mg daily. As she felt there was no specific improvement, I increased her dose to 75 mg daily. She has previously expressed a desire to be prescribed Xanax rather than other approaches. She stated she hasn't found the venlafaxine to be particularly helpful, but then later admits that her mood has been more stable, feels she is dealing with her husband on a more even keel, and denies any panic/anxiety attacks or pervasive worries.  Misty Mason complains of significant dry skin, hair loss, and nail changes. She admits to constipation as an issue. She has nt been using fiber, because she worries about the negative impacts of this. She denies cold intolerance. She wonders if there are alternatives to estrogens that she cold be on, as she feels these issues are postmenopausal changes. She denies hot flashes.  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   Sebaceous cyst- Left neck 10/07/2022   History of colonic polyps 05/28/2022   Insomnia 05/04/2022   GAD (generalized anxiety disorder) 05/04/2022   DDD (degenerative disc disease), lumbosacral 10/04/2019   Sensorineural hearing loss (SNHL) of both ears 11/06/2018   Benign paroxysmal positional vertigo of right ear 11/06/2018   Hyperlipidemia 05/05/2017   History of breast cancer 01/23/2013   Lumbar spondylosis 11/17/2010   Essential hypertension 11/17/2010   Past Surgical History:  Procedure Laterality Date   ABDOMINAL HYSTERECTOMY     COLONOSCOPY     MASTECTOMY Left 11/2007   WRIST SURGERY     Family History  Problem Relation Age of Onset   Heart attack Sister    Heart disease Paternal Aunt    Heart disease Paternal Uncle    Colon cancer Neg Hx    Esophageal cancer Neg Hx    Pancreatic cancer Neg Hx    Stomach cancer Neg Hx    Outpatient Medications Prior to Visit  Medication Sig Dispense Refill   ascorbic acid (VITAMIN C) 500 MG tablet Take 500 mg by mouth daily.     venlafaxine XR (EFFEXOR-XR) 75 MG 24 hr capsule TAKE 1 CAPSULE BY MOUTH DAILY WITH BREAKFAST. 90 capsule 2   atorvastatin (LIPITOR) 40 MG tablet Take 40 mg by mouth daily.     diclofenac Sodium (VOLTAREN) 1 % GEL Apply topically.     famotidine (PEPCID) 40  MG tablet Take by mouth.     glucose blood (ONETOUCH ULTRA) test strip 1 each as needed.     Lancets (ONETOUCH ULTRASOFT) lancets 1 each by Other route as needed.     traZODone (DESYREL) 100 MG tablet Take 100 mg by mouth at bedtime.     valsartan (DIOVAN) 80 MG tablet Take 80 mg by mouth daily.     No facility-administered medications prior to visit.   No Known Allergies Objective:   Today's Vitals   01/06/23 1025  BP: 118/68  Pulse: 69  Temp: 98.8 F (37.1 C)  TempSrc: Temporal  SpO2: 99%  Weight: 138 lb 12.8 oz (63 kg)   Height: 5' 2.5" (1.588 m)   Body mass index is 24.98 kg/m.   General: Well developed, well nourished. No acute distress. Psych: Alert and oriented. Normal mood and affect.  Health Maintenance Due  Topic Date Due   Hepatitis C Screening  Never done   Zoster Vaccines- Shingrix (1 of 2) Never done     Assessment & Plan:   Problem List Items Addressed This Visit       Cardiovascular and Mediastinum   Essential hypertension    Blood pressure is in good control. Continue valsartan 80 mg daily.      Relevant Medications   atorvastatin (LIPITOR) 20 MG tablet   valsartan (DIOVAN) 80 MG tablet     Digestive   GERD (gastroesophageal reflux disease)    Stable. Continue famotidine 40 mg bid.      Relevant Medications   famotidine (PEPCID) 40 MG tablet     Musculoskeletal and Integument   Osteoarthritis of hands, bilateral    Stable. Continue Voltaren gel as needed.      Relevant Medications   diclofenac Sodium (VOLTAREN) 1 % GEL     Other   GAD (generalized anxiety disorder) - Primary    I feel Misty Mason is having a positive response to venlafaxine, though I sense she prefers to use Xanax. BZDs are not consider first-line options. I will continue venlafaxine 75 mg daily.      Relevant Medications   traZODone (DESYREL) 100 MG tablet   Hyperlipidemia    I will give her a trial of atorvastatin 20 mg and plan to reassess her lipids in 3 months.      Relevant Medications   atorvastatin (LIPITOR) 20 MG tablet   valsartan (DIOVAN) 80 MG tablet   Insomnia    Continue trazodone 100 mg at bedtime.      Relevant Medications   traZODone (DESYREL) 100 MG tablet   Prediabetes    I discussed whether it is beneficial for Misty Mason to continue to check her blood sugars at home. She admits that she doesn't necessarily make significant changes based on results. I feel she cold stop checking at home and we could just follow her A1c. She seems hesitant to give this up.       Relevant Medications   glucose blood (ONETOUCH ULTRA) test strip   Lancets (ONETOUCH ULTRASOFT) lancets   Other Visit Diagnoses     Dry skin       Suspect this is age related and due to not drinking enough water. I will check thyroid tests to assess for hypothyroidism.   Constipation, unspecified constipation type       I recommend daily fiber intake and am not aware of any significant risks associated with this. I will make sure she is not hypothyroid.   Relevant Orders  TSH (Completed)   T4, free (Completed)       Return in about 3 months (around 04/06/2023) for Reassessment.   Loyola Mast, MD

## 2023-01-19 ENCOUNTER — Other Ambulatory Visit: Payer: Self-pay

## 2023-01-19 NOTE — Telephone Encounter (Signed)
Copied from CRM [id number]. Topic: Clinical - Prescription Issue >> Jan 19, 2023  3:26 PM Corin V wrote: Reason for CRM: [facility] the Voltaren as "arthritis cream" only and they are not able to fill it as written. Patient requested the script be sent back in as diclofenac Sodium 1 % GEL.   LFT VM to RTN call. Dm/cma

## 2023-01-24 ENCOUNTER — Telehealth: Payer: Self-pay

## 2023-01-24 NOTE — Telephone Encounter (Signed)
Copied from CRM [id number]. Topic: Clinical - Medical Advice >> Jan 24, 2023 12:42 PM Pascal Lux wrote: Reason for CRM: Fever blister on lip and would like a prescription to get it healed so she can see her dentist for a procedure. Would like for Dennise to give her a call at [phone].    Called patient and she was advised she needs to have an appointment to get medication sent to the pharmacy.  Did advised that they sell an OTC fever blister medication at pharmacy.  She will try that and if she wants to make an appointment to call us back. Dm/cma

## 2023-03-02 ENCOUNTER — Telehealth: Payer: Self-pay

## 2023-03-02 NOTE — Telephone Encounter (Signed)
Copied from CRM [id number]. Topic: Clinical - Lab/Test Results >> Mar 02, 2023 12:06 PM Aletta Edouard wrote: Reason for CRM: patient is requesting the results from her genic tests she would like a call back  Received results and results given to provider.  Will call her back to give her them. Dm/cma

## 2023-03-02 NOTE — Telephone Encounter (Signed)
Patient notified of results and no questions. Dm/cma

## 2023-03-23 ENCOUNTER — Telehealth: Payer: Self-pay

## 2023-03-23 NOTE — Telephone Encounter (Signed)
 Pharmacy Patient Advocate Encounter   Received notification from Fax that prior authorization for Diclofenac gel is required/requested.   Insurance verification completed.   The patient is insured through [facility] .   Per test claim: PA required; PA submitted to above mentioned insurance via Fax Key/confirmation #/EOC [id number] Status is pending   Phone# [phone] Fax# [phone]

## 2023-03-25 NOTE — Telephone Encounter (Signed)
 Pharmacy Patient Advocate Encounter  Received notification from [employer] that Prior Authorization for Diclofenac gel has been CANCELLED due to   PA #/Case ID/Reference #: [id number]

## 2023-04-15 ENCOUNTER — Ambulatory Visit (INDEPENDENT_AMBULATORY_CARE_PROVIDER_SITE_OTHER): Payer: Medicare HMO | Admitting: Family Medicine

## 2023-04-15 VITALS — BP 126/72 | HR 66 | Temp 97.6°F | Ht 62.5 in

## 2023-04-15 DIAGNOSIS — I10 Essential (primary) hypertension: Secondary | ICD-10-CM

## 2023-04-15 DIAGNOSIS — M19042 Primary osteoarthritis, left hand: Secondary | ICD-10-CM

## 2023-04-15 DIAGNOSIS — Z853 Personal history of malignant neoplasm of breast: Secondary | ICD-10-CM

## 2023-04-15 DIAGNOSIS — M19041 Primary osteoarthritis, right hand: Secondary | ICD-10-CM | POA: Diagnosis not present

## 2023-04-15 MED ORDER — DICLOFENAC SODIUM 1 % EX GEL
2.0000 g | Freq: Four times a day (QID) | CUTANEOUS | 3 refills | Status: DC | PRN
Start: 2023-04-15 — End: 2023-04-20

## 2023-04-15 NOTE — Assessment & Plan Note (Signed)
Blood pressure is in good control. Continue valsartan 80 mg daily.

## 2023-04-15 NOTE — Progress Notes (Signed)
 [facility] [address] [place] [address] Dept: [phone] Dept Fax: [phone]  Chronic Care Office Visit  Subjective:    Patient ID: Arzella Rehmann, female    DOB: 05-Jun-1945, 78 y.o..   MRN: [id number]  Chief Complaint  Patient presents with   Anxiety    3 month f/u.  C/o hand pain.    History of Present Illness:  Patient is in today for reassessment of chronic medical issues.  Ms. Elias has hypertension. She is managed on valsartan 80 mg daily.   Ms. Wiater has hyperlipidemia. She is managed on atorvastatin 20 mg daily. We reduced her dosage at her last visit.  Ms. Duba has a history of breast cancer. She had requested genetic testing for BRCA1 and 2 genes and wants to discuss the results.  Ms. Carlson has a history of bilateral osteoarthritis of the hands. She notes that her finger joints are painful much of the time. She finds she has limited ROM of the fingers, esp. with flexion. She has had some deformity of the hand joints. She is managing this by taking 800 mg of ibuprofen on a regular basis. She also uses topical diclofenac gel. She has seen Dr. Melvyn Novas in the past and did have an injection at one point.  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   Sebaceous cyst- Left neck 10/07/2022   History of colonic polyps 05/28/2022   Insomnia 05/04/2022   GAD (generalized anxiety disorder) 05/04/2022   DDD (degenerative disc disease), lumbosacral 10/04/2019   Sensorineural hearing loss (SNHL) of both ears 11/06/2018   Benign paroxysmal positional vertigo of right ear 11/06/2018   Hyperlipidemia 05/05/2017   History of breast cancer 01/23/2013   Lumbar  spondylosis 11/17/2010   Essential hypertension 11/17/2010   Past Surgical History:  Procedure Laterality Date   ABDOMINAL HYSTERECTOMY     COLONOSCOPY     MASTECTOMY Left 11/2007   WRIST SURGERY     Family History  Problem Relation Age of Onset   Heart attack Sister    Heart disease Paternal Aunt    Heart disease Paternal Uncle    Colon cancer Neg Hx    Esophageal cancer Neg Hx    Pancreatic cancer Neg Hx    Stomach cancer Neg Hx    Outpatient Medications Prior to Visit  Medication Sig Dispense Refill   ascorbic acid (VITAMIN C) 500 MG tablet Take 500 mg by mouth daily.     atorvastatin (LIPITOR) 20 MG tablet Take 1 tablet (20 mg total) by mouth daily. 90 tablet 3   famotidine (PEPCID) 40 MG tablet Take 1 tablet (40 mg total) by mouth daily. 90 tablet 3   glucose blood (ONETOUCH ULTRA) test strip 1 each by Other route as needed. 100 each 2   Lancets (ONETOUCH ULTRASOFT) lancets 1 each by Other route as needed. 100 each 2   traZODone (DESYREL) 100 MG tablet Take 1 tablet (100 mg total) by mouth at bedtime. 90 tablet 3   valsartan (DIOVAN) 80 MG tablet Take 1 tablet (80 mg total) by mouth daily. 90 tablet 3   venlafaxine XR (EFFEXOR-XR) 75 MG 24 hr capsule TAKE 1 CAPSULE BY MOUTH DAILY WITH BREAKFAST. 90 capsule 2   diclofenac Sodium (VOLTAREN) 1 %  GEL Apply 2 g topically 4 (four) times daily as needed. 100 g 3   No facility-administered medications prior to visit.   No Known Allergies Objective:   Today's Vitals   04/15/23 1042  BP: 126/72  Pulse: 66  Temp: 97.6 F (36.4 C)  TempSrc: Temporal  SpO2: 99%  Height: 5' 2.5" (1.588 m)   Body mass index is 24.98 kg/m.   General: Well developed, well nourished. No acute distress. Extremities: There are multiple nodular changes to the DIP and PIP joints. There is some joint deformity of the certain   PIP joints. Patient exhibiting an inability to full flex the fingers into the hand. Psych: Alert and oriented. Normal mood  and affect.  Health Maintenance Due  Topic Date Due   Hepatitis C Screening  Never done   Zoster Vaccines- Shingrix (1 of 2) Never done   Medicare Annual Wellness (AWV)  05/04/2023   Lab Results BRCA1-2 [facility] (02/28/2023)- Negative    Assessment & Plan:   Problem List Items Addressed This Visit       Cardiovascular and Mediastinum   Essential hypertension - Primary   Blood pressure is in good control. Continue valsartan 80 mg daily.        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 I recommend heat with ROM of exercises. Discussed using Tylenol Arthritis 650 mg up to 4 times a day. She should limit her ibuprofen to 200 mg up to four times a day and can take this along with her Tylenol. I will refer her to Dr. Orlan Leavens for an evaluation and management recommendations.      Relevant Medications   diclofenac Sodium (VOLTAREN) 1 % GEL   Other Relevant Orders   Ambulatory referral to Orthopedics     Other   History of breast cancer   Discussed negative gene testing and implications for her daughter.      Return in about 3 months (around 07/16/2023) for Reassessment.   Loyola Mast, MD

## 2023-04-15 NOTE — Assessment & Plan Note (Signed)
 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 I recommend heat with ROM of exercises. Discussed using Tylenol Arthritis 650 mg up to 4 times a day. She should limit her ibuprofen to 200 mg up to four times a day and can take this along with her Tylenol. I will refer her to Dr. Orlan Leavens for an evaluation and management recommendations.

## 2023-04-15 NOTE — Assessment & Plan Note (Signed)
 Discussed negative gene testing and implications for her daughter.

## 2023-04-19 ENCOUNTER — Telehealth: Payer: Self-pay

## 2023-04-19 NOTE — Telephone Encounter (Signed)
 Copied from CRM [id number]. Topic: Clinical - Prescription Issue >> Apr 19, 2023 12:06 PM Chantha C wrote: Reason for CRM: Patient wants to speak with enise on diclofenac Sodium (VOLTAREN) 1 % GEL, has new information and needs to discuss with Angelique Blonder only. Please call back [phone].

## 2023-04-19 NOTE — Telephone Encounter (Signed)
Left VM to rtn call. Dm/cma       

## 2023-04-20 ENCOUNTER — Other Ambulatory Visit: Payer: Self-pay

## 2023-04-20 DIAGNOSIS — M19041 Primary osteoarthritis, right hand: Secondary | ICD-10-CM

## 2023-04-20 MED ORDER — DICLOFENAC SODIUM 1 % EX GEL
2.0000 g | Freq: Four times a day (QID) | CUTANEOUS | 3 refills | Status: AC | PRN
Start: 2023-04-20 — End: ?

## 2023-04-20 NOTE — Telephone Encounter (Signed)
 Spoke to patient she states [facility] and has to be [facility] with NDC#  [id number].  [facility]  Dm/cma

## 2023-04-20 NOTE — Telephone Encounter (Signed)
Left VM to rtn call. Dm/cma       

## 2023-04-20 NOTE — Telephone Encounter (Signed)
 Copied from CRM [id number]. Topic: General - Call Back - No Documentation >> Apr 20, 2023  8:51 AM Pascal Lux wrote: Reason for CRM: Patient called requesting a call back from [facility] regarding diclofenac Sodium (VOLTAREN) 1 % GEL.

## 2023-05-03 DIAGNOSIS — M79642 Pain in left hand: Secondary | ICD-10-CM | POA: Diagnosis not present

## 2023-05-03 DIAGNOSIS — M79641 Pain in right hand: Secondary | ICD-10-CM | POA: Diagnosis not present

## 2023-06-03 ENCOUNTER — Ambulatory Visit (INDEPENDENT_AMBULATORY_CARE_PROVIDER_SITE_OTHER): Payer: Medicare HMO

## 2023-06-03 DIAGNOSIS — Z0000 Encounter for general adult medical examination without abnormal findings: Secondary | ICD-10-CM

## 2023-06-03 NOTE — Patient Instructions (Signed)
 Misty Mason , Thank you for taking time to come for your Medicare Wellness Visit. I appreciate your ongoing commitment to your health goals. Please review the following plan we discussed and let me know if I can assist you in the future.   Referrals/Orders/Follow-Ups/Clinician Recommendations: none  This is a list of the screening recommended for you and due dates:  Health Maintenance  Topic Date Due   Hepatitis C Screening  Never done   Zoster (Shingles) Vaccine (1 of 2) Never done   COVID-19 Vaccine (4 - 2024-25 season) 10/03/2022   Flu Shot  09/02/2023   Medicare Annual Wellness Visit  06/02/2024   DTaP/Tdap/Td vaccine (2 - Tdap) 02/01/2025   Pneumonia Vaccine  Completed   DEXA scan (bone density measurement)  Completed   HPV Vaccine  Aged Out   Meningitis B Vaccine  Aged Out   Colon Cancer Screening  Discontinued    Advanced directives: (Copy Requested) Please bring a copy of your health care power of attorney and living will to the office to be added to your chart at your convenience. You can mail to [facility] [address], [place] [address] or email to [email or web address]  Next Medicare Annual Wellness Visit scheduled for next year: Yes  Have you seen your provider in the last 6 months (3 months if uncontrolled diabetes)? Yes, has an appointment 07/18/2023  insert Preventive Care attachment Insert FALL PREVENTION attachment if needed

## 2023-06-03 NOTE — Progress Notes (Signed)
 Subjective:   Misty Mason is a 78 y.o. who presents for a Medicare Wellness preventive visit.  Visit Complete: Virtual I connected with  Druscilla Gerhard on 06/03/23 by a video and audio enabled telemedicine application and verified that I am speaking with the correct person using two identifiers.  Patient Location: Home  Provider Location: Office/Clinic  I discussed the limitations of evaluation and management by telemedicine. The patient expressed understanding and agreed to proceed.  Vital Signs: Because this visit was a virtual/telehealth visit, some criteria may be missing or patient reported. Any vitals not documented were not able to be obtained and vitals that have been documented are patient reported.  VideoError- Librarian, academic were attempted between this provider and patient, however failed, due to patient having technical difficulties OR patient did not have access to video capability.  We continued and completed visit with audio only.   Persons Participating in Visit: Patient.  AWV Questionnaire: No: Patient Medicare AWV questionnaire was not completed prior to this visit.  Cardiac Risk Factors include: advanced age (>26men, >28 women);dyslipidemia;hypertension     Objective:    Today's Vitals   There is no height or weight on file to calculate BMI.     06/03/2023    3:04 PM 08/23/2022    1:28 PM 05/19/2017    4:42 AM  Advanced Directives  Does Patient Have a Medical Advance Directive? Yes No Yes  Type of Estate agent of [place];Living will  Living will  Copy of Healthcare Power of Attorney in Chart? Yes - validated most recent copy scanned in chart (See row information)    Would patient like information on creating a medical advance directive?   No - Patient declined    Current Medications (verified) Outpatient Encounter Medications as of 06/03/2023  Medication Sig   ascorbic acid (VITAMIN C) 500 MG tablet  Take 500 mg by mouth daily.   atorvastatin  (LIPITOR) 20 MG tablet Take 1 tablet (20 mg total) by mouth daily.   diclofenac  Sodium (VOLTAREN ) 1 % GEL Apply 2 g topically 4 (four) times daily as needed.   famotidine  (PEPCID ) 40 MG tablet Take 1 tablet (40 mg total) by mouth daily.   glucose blood (ONETOUCH ULTRA) test strip 1 each by Other route as needed.   Lancets (ONETOUCH ULTRASOFT) lancets 1 each by Other route as needed.   traZODone  (DESYREL ) 100 MG tablet Take 1 tablet (100 mg total) by mouth at bedtime.   valsartan  (DIOVAN ) 80 MG tablet Take 1 tablet (80 mg total) by mouth daily.   venlafaxine  XR (EFFEXOR -XR) 75 MG 24 hr capsule TAKE 1 CAPSULE BY MOUTH DAILY WITH BREAKFAST.   No facility-administered encounter medications on file as of 06/03/2023.    Allergies (verified) Patient has no known allergies.   History: Past Medical History:  Diagnosis Date   Arthritis    Back pain    Breast cancer (HCC) 2009   GERD (gastroesophageal reflux disease)    High cholesterol    Hypertension    Osteoporosis    Past Surgical History:  Procedure Laterality Date   ABDOMINAL HYSTERECTOMY     COLONOSCOPY     MASTECTOMY Left 11/2007   WRIST SURGERY     Family History  Problem Relation Age of Onset   Heart attack Sister    Heart disease Paternal Aunt    Heart disease Paternal Uncle    Colon cancer Neg Hx    Esophageal cancer Neg Hx  Pancreatic cancer Neg Hx    Stomach cancer Neg Hx    Social History   Socioeconomic History   Marital status: Married    Spouse name: Not on file   Number of children: 2   Years of education: Not on file   Highest education level: Associate degree: occupational, Scientist, product/process development, or vocational program  Occupational History   Not on file  Tobacco Use   Smoking status: Never   Smokeless tobacco: Never  Vaping Use   Vaping status: Never Used  Substance and Sexual Activity   Alcohol use: Yes    Comment: drinks 2 glasses of wine q hs   Drug use: Never    Sexual activity: Not Currently  Other Topics Concern   Not on file  Social History Narrative   Not on file   Social Drivers of Health   Financial Resource Strain: Low Risk  (06/03/2023)   Overall Financial Resource Strain (CARDIA)    Difficulty of Paying Living Expenses: Not hard at all  Food Insecurity: No Food Insecurity (06/03/2023)   Hunger Vital Sign    Worried About Running Out of Food in the Last Year: Never true    Ran Out of Food in the Last Year: Never true  Transportation Needs: No Transportation Needs (06/03/2023)   PRAPARE - Administrator, Civil Service (Medical): No    Lack of Transportation (Non-Medical): No  Physical Activity: Inactive (06/03/2023)   Exercise Vital Sign    Days of Exercise per Week: 0 days    Minutes of Exercise per Session: 0 min  Stress: Stress Concern Present (06/03/2023)   [employer] of Occupational Health - Occupational Stress Questionnaire    Feeling of Stress : To some extent  Social Connections: Moderately Integrated (06/03/2023)   Social Connection and Isolation Panel [NHANES]    Frequency of Communication with Friends and Family: More than three times a week    Frequency of Social Gatherings with Friends and Family: Twice a week    Attends Religious Services: Never    Database administrator or Organizations: Yes    Attends Engineer, structural: More than 4 times per year    Marital Status: Married    Tobacco Counseling Counseling given: Not Answered    Clinical Intake:  Pre-visit preparation completed: Yes  Pain : No/denies pain     Nutritional Risks: None Diabetes: No  No results found for: "HGBA1C"   How often do you need to have someone help you when you read instructions, pamphlets, or other written materials from your doctor or pharmacy?: 1 - Never  Interpreter Needed?: No  Information entered by :: NAllen LPN   Activities of Daily Living     06/03/2023    2:58 PM  In your present state  of health, do you have any difficulty performing the following activities:  Hearing? 1  Comment has hearing aids  Vision? 0  Difficulty concentrating or making decisions? 0  Walking or climbing stairs? 1  Dressing or bathing? 0  Doing errands, shopping? 0  Preparing Food and eating ? N  Using the Toilet? N  In the past six months, have you accidently leaked urine? N  Do you have problems with loss of bowel control? N  Managing your Medications? N  Managing your Finances? N  Housekeeping or managing your Housekeeping? N    Patient Care Team: Graig Lawyer, MD as PCP - General (Family Medicine)  Indicate any recent Medical Services  you may have received from other than [facility] providers in the past year (date may be approximate).     Assessment:   This is a routine wellness examination for [place].  Hearing/Vision screen Hearing Screening - Comments:: Has hearing aids that are maintained Vision Screening - Comments:: Regular eye exams, Dr. Joannie Muff   Goals Addressed             This Visit's Progress    Patient Stated       06/21/2023, wants to lose weight and exercise best she can       Depression Screen     06/03/2023    3:06 PM 01/06/2023   10:34 AM 10/07/2022    9:04 AM  PHQ 2/9 Scores  PHQ - 2 Score 0 0 1  PHQ- 9 Score 1  2    Fall Risk     06/03/2023    3:05 PM 01/06/2023   10:33 AM 10/07/2022    9:04 AM  Fall Risk   Falls in the past year? 0 0 1  Number falls in past yr: 0 0 1  Injury with Fall? 0 0 1  Risk for fall due to : Impaired balance/gait No Fall Risks History of fall(s)  Follow up Falls prevention discussed;Falls evaluation completed Falls evaluation completed Falls evaluation completed    MEDICARE RISK AT HOME:  Medicare Risk at Home Any stairs in or around the home?: No If so, are there any without handrails?: No Home free of loose throw rugs in walkways, pet beds, electrical cords, etc?: Yes Adequate lighting in your home to reduce risk of  falls?: Yes Life alert?: No Use of a cane, walker or w/c?: No Grab bars in the bathroom?: Yes Shower chair or bench in shower?: Yes Elevated toilet seat or a handicapped toilet?: No  TIMED UP AND GO:  Was the test performed?  No  Cognitive Function: 6CIT completed        06/03/2023    3:08 PM  6CIT Screen  What Year? 0 points  What month? 0 points  What time? 0 points  Count back from 20 0 points  Months in reverse 0 points  Repeat phrase 2 points  Total Score 2 points    Immunizations Immunization History  Administered Date(s) Administered   Fluad Quad(high Dose 65+) 11/01/2019, 12/30/2021   Fluad Trivalent(High Dose 65+) 11/17/2010, 01/18/2013, 10/10/2013, 10/07/2022   Influenza Inj Mdck Quad Pf 12/02/2016   PFIZER(Purple Top)SARS-COV-2 Vaccination 03/16/2019, 04/10/2019, 01/01/2020   PNEUMOCOCCAL CONJUGATE-20 11/18/2022   Pneumococcal Polysaccharide-23 06/14/2011   Td 02/02/2015    Screening Tests Health Maintenance  Topic Date Due   Hepatitis C Screening  Never done   Zoster Vaccines- Shingrix (1 of 2) Never done   COVID-19 Vaccine (4 - 2024-25 season) 10/03/2022   INFLUENZA VACCINE  09/02/2023   Medicare Annual Wellness (AWV)  06/02/2024   DTaP/Tdap/Td (2 - Tdap) 02/01/2025   Pneumonia Vaccine 48+ Years old  Completed   DEXA SCAN  Completed   HPV VACCINES  Aged Out   Meningococcal B Vaccine  Aged Out   Colonoscopy  Discontinued    Health Maintenance  Health Maintenance Due  Topic Date Due   Hepatitis C Screening  Never done   Zoster Vaccines- Shingrix (1 of 2) Never done   COVID-19 Vaccine (4 - 2024-25 season) 10/03/2022   Health Maintenance Items Addressed: Declines covid vaccine. Due for shingles vaccine. Hep C screening due  Additional Screening:  Vision Screening: Recommended annual  ophthalmology exams for early detection of glaucoma and other disorders of the eye.  Dental Screening: Recommended annual dental exams for proper oral  hygiene  Community Resource Referral / Chronic Care Management: CRR required this visit?  No   CCM required this visit?  No     Plan:     I have personally reviewed and noted the following in the patient's chart:   Medical and social history Use of alcohol, tobacco or illicit drugs  Current medications and supplements including opioid prescriptions. Patient is not currently taking opioid prescriptions. Functional ability and status Nutritional status Physical activity Advanced directives List of other physicians Hospitalizations, surgeries, and ER visits in previous 12 months Vitals Screenings to include cognitive, depression, and falls Referrals and appointments  In addition, I have reviewed and discussed with patient certain preventive protocols, quality metrics, and best practice recommendations. A written personalized care plan for preventive services as well as general preventive health recommendations were provided to patient.     Areatha Beecham, LPN   08/09/2954   After Visit Summary: (MyChart) Due to this being a telephonic visit, the after visit summary with patients personalized plan was offered to patient via MyChart   Notes: Nothing significant to report at this time.

## 2023-06-14 DIAGNOSIS — M79642 Pain in left hand: Secondary | ICD-10-CM | POA: Diagnosis not present

## 2023-06-14 DIAGNOSIS — M25531 Pain in right wrist: Secondary | ICD-10-CM | POA: Diagnosis not present

## 2023-06-14 DIAGNOSIS — M79641 Pain in right hand: Secondary | ICD-10-CM | POA: Diagnosis not present

## 2023-07-08 ENCOUNTER — Inpatient Hospital Stay: Admission: RE | Admit: 2023-07-08 | Payer: Medicare HMO | Source: Ambulatory Visit

## 2023-07-13 ENCOUNTER — Ambulatory Visit: Payer: Self-pay | Admitting: Family Medicine

## 2023-07-13 ENCOUNTER — Ambulatory Visit

## 2023-07-13 DIAGNOSIS — M810 Age-related osteoporosis without current pathological fracture: Secondary | ICD-10-CM | POA: Diagnosis not present

## 2023-07-13 DIAGNOSIS — Z13820 Encounter for screening for osteoporosis: Secondary | ICD-10-CM | POA: Diagnosis not present

## 2023-07-13 DIAGNOSIS — Z780 Asymptomatic menopausal state: Secondary | ICD-10-CM | POA: Diagnosis not present

## 2023-07-18 ENCOUNTER — Encounter: Payer: Self-pay | Admitting: Family Medicine

## 2023-07-18 ENCOUNTER — Ambulatory Visit (INDEPENDENT_AMBULATORY_CARE_PROVIDER_SITE_OTHER): Admitting: Family Medicine

## 2023-07-18 VITALS — BP 126/72 | HR 94 | Temp 97.4°F | Ht 62.5 in | Wt 147.4 lb

## 2023-07-18 DIAGNOSIS — L659 Nonscarring hair loss, unspecified: Secondary | ICD-10-CM

## 2023-07-18 DIAGNOSIS — I10 Essential (primary) hypertension: Secondary | ICD-10-CM | POA: Diagnosis not present

## 2023-07-18 DIAGNOSIS — E538 Deficiency of other specified B group vitamins: Secondary | ICD-10-CM | POA: Diagnosis not present

## 2023-07-18 DIAGNOSIS — Z0000 Encounter for general adult medical examination without abnormal findings: Secondary | ICD-10-CM | POA: Insufficient documentation

## 2023-07-18 DIAGNOSIS — R5383 Other fatigue: Secondary | ICD-10-CM

## 2023-07-18 DIAGNOSIS — M810 Age-related osteoporosis without current pathological fracture: Secondary | ICD-10-CM

## 2023-07-18 DIAGNOSIS — M19042 Primary osteoarthritis, left hand: Secondary | ICD-10-CM | POA: Diagnosis not present

## 2023-07-18 DIAGNOSIS — M19041 Primary osteoarthritis, right hand: Secondary | ICD-10-CM | POA: Diagnosis not present

## 2023-07-18 NOTE — Assessment & Plan Note (Addendum)
 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 I recommend heat with ROM of exercises. Continue Tylenol Arthritis 650 mg up to 4 times a day and celecoxib 200 mg daily. I do not feel hydroxychloroquine would have any benefit for her hand arthritis, as there is no sign of this being an autoimmune issue.

## 2023-07-18 NOTE — Progress Notes (Signed)
 [facility] [address] [place] [address] Dept: [phone] Dept Fax: [phone]  Annual Physical Visit  Subjective:    Patient ID: Misty Mason, female    DOB: 03/23/1945, 78 y.o..   MRN: [id number]  Chief Complaint  Patient presents with   Annual Exam    CPE/labs.  Not fasting today.  ? Meds for hand, discuss bone density.    History of Present Illness:  Patient is in today for an annual physical/preventative visit.  Misty Mason has hypertension. She is managed on valsartan  80 mg daily.   Misty Mason has hyperlipidemia. She is managed on atorvastatin  20 mg daily.   Misty Mason has a history of bilateral osteoarthritis of the hands. She notes that her finger joints are painful much of the time. She finds she has limited ROM of the fingers, esp. with flexion. She has had some deformity of the hand joints. She is managing this by taking celecoxib 200 mg daily and Tylenol Arthritis bid. She has seen Dr. Annamae Barrett in the past and did have an injection at one point. She asks is Plaquenil will help this.  Misty Mason has a history of osteoporosis. She completed a 5-year course of  alendronate earlier this year. She does take daily calcium  with Vitamin D.  Review of Systems  Constitutional:  Positive for malaise/fatigue. Negative for chills, diaphoresis, fever and weight loss.       Complains of regular feelings of fatigue and lack of energy.  HENT:  Positive for hearing loss. Negative for congestion, ear pain, sinus pain, sore throat and tinnitus.        Wears bilateral hearing aids.  Eyes:  Negative for blurred vision, pain, discharge and redness.  Respiratory:  Positive for cough. Negative for shortness of breath and wheezing.        Admits to occasional, but persistent cough. Has minor allergy issues, but does get some acid reflux.  Cardiovascular:  Negative for chest pain and palpitations.  Gastrointestinal:  Positive  for constipation and heartburn. Negative for abdominal pain, diarrhea, nausea and vomiting.       Acid reflux as noted above. Constipation is a persistent issue. Used Miralax, but that can then overcorrect and cause some diarrhea.  Musculoskeletal:  Positive for joint pain. Negative for back pain and myalgias.       Hand joint pain as noted above.  Skin:  Negative for itching and rash.       Notes thinning of hair. her mother and aunts had similar.  Psychiatric/Behavioral:  Negative for depression. The patient is not nervous/anxious.    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   Sebaceous cyst- Left neck 10/07/2022   History of colonic polyps 05/28/2022   Insomnia 05/04/2022   GAD (generalized anxiety disorder) 05/04/2022   DDD (degenerative disc disease), lumbosacral 10/04/2019   Sensorineural hearing loss (SNHL) of both ears 11/06/2018   Benign paroxysmal positional vertigo of right ear 11/06/2018   Hyperlipidemia 05/05/2017   History of breast cancer 01/23/2013   Lumbar spondylosis 11/17/2010   Essential hypertension 11/17/2010   Past Surgical History:  Procedure Laterality Date   ABDOMINAL HYSTERECTOMY     COLONOSCOPY     MASTECTOMY Left 11/2007   WRIST SURGERY  Family History  Problem Relation Age of Onset   Heart attack Sister    Heart disease Paternal Aunt    Heart disease Paternal Uncle    Colon cancer Neg Hx    Esophageal cancer Neg Hx    Pancreatic cancer Neg Hx    Stomach cancer Neg Hx    Outpatient Medications Prior to Visit  Medication Sig Dispense Refill   ascorbic acid (VITAMIN C) 500 MG tablet Take 500 mg by mouth daily.     atorvastatin  (LIPITOR) 20 MG tablet Take 1 tablet (20 mg total) by mouth daily. 90  tablet 3   celecoxib (CELEBREX) 200 MG capsule Take 200 mg by mouth 2 (two) times daily.     diclofenac  Sodium (VOLTAREN ) 1 % GEL Apply 2 g topically 4 (four) times daily as needed. 100 g 3   famotidine  (PEPCID ) 40 MG tablet Take 1 tablet (40 mg total) by mouth daily. 90 tablet 3   glucose blood (ONETOUCH ULTRA) test strip 1 each by Other route as needed. 100 each 2   Lancets (ONETOUCH ULTRASOFT) lancets 1 each by Other route as needed. 100 each 2   traZODone  (DESYREL ) 100 MG tablet Take 1 tablet (100 mg total) by mouth at bedtime. 90 tablet 3   valsartan  (DIOVAN ) 80 MG tablet Take 1 tablet (80 mg total) by mouth daily. 90 tablet 3   venlafaxine  XR (EFFEXOR -XR) 75 MG 24 hr capsule TAKE 1 CAPSULE BY MOUTH DAILY WITH BREAKFAST. 90 capsule 2   No facility-administered medications prior to visit.   No Known Allergies Objective:   Today's Vitals   07/18/23 0946  BP: 126/72  Pulse: 94  Temp: (!) 97.4 F (36.3 C)  TempSrc: Temporal  SpO2: 99%  Weight: 147 lb 6.4 oz (66.9 kg)  Height: 5' 2.5 (1.588 m)   Body mass index is 26.53 kg/m.   General: Well developed, well nourished. No acute distress. HEENT: Normocephalic, non-traumatic. PERRL, EOMI. Conjunctiva clear. External ears   normal. EAC and TMs normal bilaterally. Nose clear without congestion or rhinorrhea. Mucous   membranes moist. Oropharynx clear. Good dentition. Neck: Supple. No lymphadenopathy. No thyromegaly. Lungs: Clear to auscultation bilaterally. No wheezing, rales or rhonchi. CV: RRR without murmurs or rubs. Pulses 2+ bilaterally. Abdomen: Soft, non-tender. Bowel sounds positive, normal pitch and frequency. No   hepatosplenomegaly. No rebound or guarding. Extremities: Full ROM. No joint swelling or tenderness. No edema noted. Skin: Warm and dry. No rashes. Hair is thinning across the apex and down towards the right   frontal area. Psych: Alert and oriented. Normal mood and affect.  Health Maintenance Due  Topic  Date Due   Hepatitis C Screening  Never done   Zoster Vaccines- Shingrix (1 of 2) Never done   Imaging: Bone Density (07/13/2023) FINDINGS: Scan quality: Good.   LUMBAR SPINE (L1, L3): BMD (in g/cm2): 0.992 T-score: -1.4 Z-score: 0.4   LEFT FEMORAL NECK: BMD (in g/cm2): 0.750 T-score: -2.1 Z-score: 0.0   LEFT TOTAL HIP: BMD (in g/cm2): 0.788 T-score: -1.7 Z-score: 0.2   RIGHT FEMORAL NECK: BMD (in g/cm2): 0.668 T-score: -2.7 Z-score: -0.6   RIGHT TOTAL HIP: BMD (in g/cm2): 0.720 T-score: -2.3 Z-score: -0.3    IMPRESSION: Osteoporosis based on BMD.    Assessment & Plan:   Problem List Items Addressed This Visit       Cardiovascular and Mediastinum   Essential hypertension   Blood pressure is in good control. Continue valsartan  80 mg daily.  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 I recommend heat with ROM of exercises. Continue Tylenol Arthritis 650 mg up to 4 times a day and celecoxib 200 mg daily. I do not feel hydroxychloroquine would have any benefit for her hand arthritis, as there is no sign of this being an autoimmune issue.      Relevant Medications   celecoxib (CELEBREX) 200 MG capsule   Osteoporosis   Dexa scan shows osteoporotic range for right femoral neck. She is very concerned about potential osteoporosis risk. Recommend she consider Prolia. I will refer her to the Osteoporosis Clinic for evaluation.      Relevant Orders   Amb Referral to Osteoporosis Management      Other   Annual physical exam - Primary   Overall health is good. Recommend she start some sort of  regular exercise. Discussed recommended screenings and immunizations.       Vitamin B12 deficiency   I will reassess her Vitamin B12 in light of her fatigue.      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 I will refer her to dermatology to discuss optiosn, as she ahs failed Rogaine use.   Relevant Orders   Ambulatory referral to Dermatology     Other fatigue       I will have her return for screenign labs to look for potential causes. She should hold her biotin for 48 hours prior to thyroid  testing.   Relevant Orders   CBC   Comprehensive metabolic panel with GFR   Vitamin B12   TSH      Return in about 3 months (around 10/18/2023) for Reassessment.   Graig Lawyer, MD

## 2023-07-18 NOTE — Assessment & Plan Note (Signed)
 Dexa scan shows osteoporotic range for right femoral neck. She is very concerned about potential osteoporosis risk. Recommend she consider Prolia. I will refer her to the Osteoporosis Clinic for evaluation.

## 2023-07-18 NOTE — Assessment & Plan Note (Signed)
 I will reassess her Vitamin B12 in light of her fatigue.

## 2023-07-18 NOTE — Assessment & Plan Note (Signed)
 Overall health is good. Recommend she start some sort of  regular exercise. Discussed recommended screenings and immunizations.

## 2023-07-18 NOTE — Assessment & Plan Note (Signed)
Blood pressure is in good control. Continue valsartan 80 mg daily.

## 2023-07-26 ENCOUNTER — Other Ambulatory Visit (INDEPENDENT_AMBULATORY_CARE_PROVIDER_SITE_OTHER)

## 2023-07-26 ENCOUNTER — Ambulatory Visit: Payer: Self-pay | Admitting: Family Medicine

## 2023-07-26 DIAGNOSIS — E538 Deficiency of other specified B group vitamins: Secondary | ICD-10-CM

## 2023-07-26 DIAGNOSIS — I10 Essential (primary) hypertension: Secondary | ICD-10-CM | POA: Diagnosis not present

## 2023-07-26 DIAGNOSIS — R5383 Other fatigue: Secondary | ICD-10-CM

## 2023-07-26 DIAGNOSIS — E871 Hypo-osmolality and hyponatremia: Secondary | ICD-10-CM | POA: Insufficient documentation

## 2023-07-26 LAB — CBC
HCT: 37.7 % (ref 36.0–46.0)
Hemoglobin: 13.1 g/dL (ref 12.0–15.0)
MCHC: 34.7 g/dL (ref 30.0–36.0)
MCV: 97.3 fl (ref 78.0–100.0)
Platelets: 306 10*3/uL (ref 150.0–400.0)
RBC: 3.87 Mil/uL (ref 3.87–5.11)
RDW: 12.5 % (ref 11.5–15.5)
WBC: 6.7 10*3/uL (ref 4.0–10.5)

## 2023-07-26 LAB — COMPREHENSIVE METABOLIC PANEL WITH GFR
ALT: 14 U/L (ref 0–35)
AST: 20 U/L (ref 0–37)
Albumin: 4.3 g/dL (ref 3.5–5.2)
Alkaline Phosphatase: 73 U/L (ref 39–117)
BUN: 16 mg/dL (ref 6–23)
CO2: 30 meq/L (ref 19–32)
Calcium: 9.4 mg/dL (ref 8.4–10.5)
Chloride: 98 meq/L (ref 96–112)
Creatinine, Ser: 0.67 mg/dL (ref 0.40–1.20)
GFR: 83.38 mL/min (ref >60.00)
Glucose, Bld: 92 mg/dL (ref 70–99)
Potassium: 4.1 meq/L (ref 3.5–5.1)
Sodium: 132 meq/L — ABNORMAL LOW (ref 135–145)
Total Bilirubin: 0.9 mg/dL (ref 0.2–1.2)
Total Protein: 6.9 g/dL (ref 6.0–8.3)

## 2023-07-26 LAB — TSH: TSH: 1.11 m[IU]/L (ref 0.40–4.50)

## 2023-07-26 LAB — VITAMIN B12: Vitamin B-12: 788 pg/mL (ref 200–1100)

## 2023-08-16 DIAGNOSIS — H35362 Drusen (degenerative) of macula, left eye: Secondary | ICD-10-CM | POA: Diagnosis not present

## 2023-08-16 DIAGNOSIS — H0288A Meibomian gland dysfunction right eye, upper and lower eyelids: Secondary | ICD-10-CM | POA: Diagnosis not present

## 2023-08-16 DIAGNOSIS — H524 Presbyopia: Secondary | ICD-10-CM | POA: Diagnosis not present

## 2023-08-16 DIAGNOSIS — H43813 Vitreous degeneration, bilateral: Secondary | ICD-10-CM | POA: Diagnosis not present

## 2023-08-16 DIAGNOSIS — H0288B Meibomian gland dysfunction left eye, upper and lower eyelids: Secondary | ICD-10-CM | POA: Diagnosis not present

## 2023-08-16 DIAGNOSIS — H04123 Dry eye syndrome of bilateral lacrimal glands: Secondary | ICD-10-CM | POA: Diagnosis not present

## 2023-08-16 DIAGNOSIS — H5203 Hypermetropia, bilateral: Secondary | ICD-10-CM | POA: Diagnosis not present

## 2023-08-16 DIAGNOSIS — H52203 Unspecified astigmatism, bilateral: Secondary | ICD-10-CM | POA: Diagnosis not present

## 2023-08-16 DIAGNOSIS — H2513 Age-related nuclear cataract, bilateral: Secondary | ICD-10-CM | POA: Diagnosis not present

## 2023-08-16 DIAGNOSIS — Z0100 Encounter for examination of eyes and vision without abnormal findings: Secondary | ICD-10-CM | POA: Diagnosis not present

## 2023-10-05 ENCOUNTER — Other Ambulatory Visit: Payer: Self-pay | Admitting: Family Medicine

## 2023-10-05 DIAGNOSIS — R7303 Prediabetes: Secondary | ICD-10-CM

## 2023-10-26 ENCOUNTER — Encounter: Payer: Self-pay | Admitting: Family Medicine

## 2023-10-26 ENCOUNTER — Ambulatory Visit (INDEPENDENT_AMBULATORY_CARE_PROVIDER_SITE_OTHER): Admitting: Family Medicine

## 2023-10-26 VITALS — BP 120/68 | HR 72 | Temp 97.6°F | Ht 62.5 in | Wt 155.6 lb

## 2023-10-26 DIAGNOSIS — Z23 Encounter for immunization: Secondary | ICD-10-CM

## 2023-10-26 DIAGNOSIS — I10 Essential (primary) hypertension: Secondary | ICD-10-CM | POA: Diagnosis not present

## 2023-10-26 DIAGNOSIS — R2689 Other abnormalities of gait and mobility: Secondary | ICD-10-CM | POA: Diagnosis not present

## 2023-10-26 DIAGNOSIS — R140 Abdominal distension (gaseous): Secondary | ICD-10-CM

## 2023-10-26 MED ORDER — SIMETHICONE EXTRA STRENGTH 125 MG PO CAPS
1.0000 | ORAL_CAPSULE | Freq: Four times a day (QID) | ORAL | 3 refills | Status: AC | PRN
Start: 2023-10-26 — End: ?

## 2023-10-26 NOTE — Assessment & Plan Note (Signed)
 The etiology of this is unclear. As she has had vertigo in the past, I wonder if this might be related. I will have her see ENT to assess further.

## 2023-10-26 NOTE — Progress Notes (Signed)
 [facility] [address] [place] [address] Dept: [phone] Dept Fax: [phone]  Chronic Care Office Visit  Subjective:    Patient ID: Misty Mason, female    DOB: Aug 02, 1945, 78 y.o..   MRN: [id number]  Chief Complaint  Patient presents with   Hypertension    3 month f/u HTN.   C/o feeling off balance.  Flu shot today.    History of Present Illness:  Patient is in today for reassessment of chronic medical issues.  Ms. Johann has hypertension. She is managed on valsartan  80 mg daily.   Ms. Kreisler has hyperlipidemia. She is managed on atorvastatin  20 mg daily.  Ms. Preston notes that she has been having an issue with her balance. She finds that this is particularly an issue when walking down stairs. She must have a rail to hold on to, or else she feels she will fall. She is not necessarily noting vertigo, though this has been an issue int he past. 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 Her hearing has not been worsening recently.  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   Sebaceous cyst- Left neck 10/07/2022   History of colonic polyps 05/28/2022   Insomnia 05/04/2022   GAD (generalized anxiety disorder) 05/04/2022   DDD (degenerative disc disease), lumbosacral 10/04/2019   Sensorineural hearing loss (SNHL) of both ears 11/06/2018   Benign paroxysmal positional vertigo of right ear 11/06/2018   Hyperlipidemia 05/05/2017   History of breast cancer 01/23/2013   Lumbar spondylosis 11/17/2010   Essential hypertension 11/17/2010   Past Surgical History:  Procedure  Laterality Date   ABDOMINAL HYSTERECTOMY     COLONOSCOPY     MASTECTOMY Left 11/2007   WRIST SURGERY     Family History  Problem Relation Age of Onset   Heart attack Sister    Heart disease Paternal Aunt    Heart disease Paternal Uncle    Colon cancer Neg Hx    Esophageal cancer Neg Hx    Pancreatic cancer Neg Hx    Stomach cancer Neg Hx    Outpatient Medications Prior to Visit  Medication Sig Dispense Refill   ascorbic acid (VITAMIN C) 500 MG tablet Take 500 mg by mouth daily.     atorvastatin  (LIPITOR) 20 MG tablet Take 1 tablet (20 mg total) by mouth daily. 90 tablet 3   celecoxib (CELEBREX) 200 MG capsule Take 200 mg by mouth 2 (two) times daily.     diclofenac  Sodium (VOLTAREN ) 1 % GEL Apply 2 g topically 4 (four) times daily as needed. 100 g 3   famotidine  (PEPCID ) 40 MG tablet Take 1 tablet (40 mg total) by mouth daily. 90 tablet 3   Lancets (ONETOUCH ULTRASOFT) lancets 1 each by Other route as needed. 100 each 2   ONETOUCH ULTRA test strip USE AND DISCARD 1 TEST     STRIP AS NEEDED 100 strip 2   traZODone  (DESYREL ) 100 MG tablet Take 1 tablet (100 mg total) by mouth at bedtime. 90 tablet 3   valsartan  (DIOVAN ) 80 MG tablet Take 1 tablet (80 mg total) by mouth daily. 90 tablet 3   venlafaxine  XR (EFFEXOR -XR) 75  MG 24 hr capsule TAKE 1 CAPSULE BY MOUTH DAILY WITH BREAKFAST. 90 capsule 2   No facility-administered medications prior to visit.   No Known Allergies Objective:   Today's Vitals   10/26/23 1033  BP: 120/68  Pulse: 72  Temp: 97.6 F (36.4 C)  TempSrc: Temporal  SpO2: 98%  Weight: 155 lb 9.6 oz (70.6 kg)  Height: 5' 2.5 (1.588 m)   Body mass index is 28.01 kg/m.   General: Well developed, well nourished. No acute distress. HEENT: Normocephalic, non-traumatic. External ears normal. EAC and TMs normal bilaterally. PERRL,   EOMI. Conjunctiva clear. Nose clear without congestion or rhinorrhea. Mucous membranes moist.   Oropharynx clear. Good  dentition. Neuro: CN II-XII intact. Normal sensation and DTR bilaterally. Psych: Alert and oriented. Normal mood and affect.  Health Maintenance Due  Topic Date Due   Hepatitis C Screening  Never done   Zoster Vaccines- Shingrix (1 of 2) Never done   Influenza Vaccine  09/02/2023     Assessment & Plan:   Problem List Items Addressed This Visit       Cardiovascular and Mediastinum   Essential hypertension - Primary   Blood pressure is in good control. Continue valsartan  80 mg daily.        Other   Balance disorder   The etiology of this is unclear. As she has had vertigo in the past, I wonder if this might be related. I will have her see ENT to assess further.       Relevant Orders   Ambulatory referral to ENT   Other Visit Diagnoses       Gassiness       Previoulsy received a Rx for simethicone . I will see if her insurance will cover this, but did explain this med is available OTC, if not.   Relevant Medications   Simethicone  Extra Strength 125 MG CAPS     Need for immunization against influenza       Relevant Orders   Flu vaccine HIGH DOSE PF(Fluzone Trivalent) (Completed)       Return in about 4 months (around 02/25/2024).   [name], MD

## 2023-10-26 NOTE — Assessment & Plan Note (Signed)
Blood pressure is in good control. Continue valsartan 80 mg daily.

## 2023-12-18 ENCOUNTER — Encounter: Payer: Self-pay | Admitting: Family Medicine

## 2023-12-20 ENCOUNTER — Ambulatory Visit

## 2023-12-20 NOTE — Progress Notes (Signed)
 Pt came in for a nurse visit today to have her blood pressure checked. Pts bp, pulse and O2 were taken after pt had been sitting/resting for over 5 minutes. Pt has been checking her BP at home and reports that she has been getting elevated readings in the 150s-190s, and an elevated pulse rate as well. Pt reports that she has not had any chest pain, palpitations, or lightheadedness. She does not have a Cardiologist. Pt also reports that her BP machine is at least 79 years old and she will also sometimes use a wrist cuff that she has. I have given her a BP log printed from [facility] to record her bp readings and instructed her to bring it with her to her follow up appt. I instructed pt to continue monitoring her BP/pulse and to call us  if her BP is consistently over 140/90.

## 2023-12-21 ENCOUNTER — Other Ambulatory Visit: Payer: Self-pay | Admitting: Family Medicine

## 2023-12-21 DIAGNOSIS — E782 Mixed hyperlipidemia: Secondary | ICD-10-CM

## 2023-12-21 DIAGNOSIS — I10 Essential (primary) hypertension: Secondary | ICD-10-CM

## 2023-12-21 DIAGNOSIS — F5101 Primary insomnia: Secondary | ICD-10-CM

## 2024-01-05 ENCOUNTER — Encounter (INDEPENDENT_AMBULATORY_CARE_PROVIDER_SITE_OTHER): Payer: Self-pay | Admitting: Physician Assistant

## 2024-01-05 ENCOUNTER — Ambulatory Visit (INDEPENDENT_AMBULATORY_CARE_PROVIDER_SITE_OTHER): Admitting: Physician Assistant

## 2024-01-05 VITALS — BP 146/70 | HR 68 | Temp 97.7°F | Ht 62.0 in | Wt 155.0 lb

## 2024-01-05 DIAGNOSIS — H8110 Benign paroxysmal vertigo, unspecified ear: Secondary | ICD-10-CM | POA: Diagnosis not present

## 2024-01-05 DIAGNOSIS — H9313 Tinnitus, bilateral: Secondary | ICD-10-CM | POA: Diagnosis not present

## 2024-01-05 DIAGNOSIS — H903 Sensorineural hearing loss, bilateral: Secondary | ICD-10-CM | POA: Diagnosis not present

## 2024-01-05 DIAGNOSIS — R2689 Other abnormalities of gait and mobility: Secondary | ICD-10-CM | POA: Diagnosis not present

## 2024-01-05 NOTE — Progress Notes (Signed)
 Dear Dr. Thedora, Here is my assessment for our mutual patient, Misty Mason. Thank you for allowing me the opportunity to care for your patient. Please do not hesitate to contact me should you have any other questions. Sincerely, Chyrl Cohen PA-C  Otolaryngology Clinic Note Referring provider: Dr. Thedora HPI:  Misty Mason is a 78 y.o. female kindly referred by Dr. Thedora   Discussed the use of AI scribe software for clinical note transcription with the patient, who gave verbal consent to proceed.  History of Present Illness    Misty Mason is a 78 year old female who presents with worsening balance and vertigo. She was referred by Dr. Thedora for evaluation of balance issues before starting physical therapy.  She has experienced worsening balance over the past couple of years, leading to several falls and injuries. She has difficulty recovering balance if she starts to fall, particularly when going down stairs, where she 'loses all balance totally'.  She experiences vertigo several times a year, characterized by difficulty walking in a straight line and a sensation of being 'out of control' upon waking. These episodes are not associated with room-spinning dizziness and typically resolve after resting.  She has a long-standing history of tinnitus for over 20 years, which is alleviated by her hearing devices. Her hearing has worsened over time, and she had a hearing test earlier this year.  No neurologic symptoms such as numbness, tingling, or weakness in her face or arms. She has osteoarthritis, causing pain in her hands, but does not associate this with her balance issues.           Independent Review of Additional Tests or Records:  none   PMH/Meds/All/SocHx/FamHx/ROS:   Past Medical History:  Diagnosis Date   Arthritis    Back pain    Breast cancer (HCC) 2009   GERD (gastroesophageal reflux disease)    High cholesterol    Hypertension    Osteoporosis      Past Surgical  History:  Procedure Laterality Date   ABDOMINAL HYSTERECTOMY     COLONOSCOPY     MASTECTOMY Left 11/2007   WRIST SURGERY      Family History  Problem Relation Age of Onset   Heart attack Sister    Heart disease Paternal Aunt    Heart disease Paternal Uncle    Colon cancer Neg Hx    Esophageal cancer Neg Hx    Pancreatic cancer Neg Hx    Stomach cancer Neg Hx      Social Connections: Moderately Integrated (06/03/2023)   Social Connection and Isolation Panel    Frequency of Communication with Friends and Family: More than three times a week    Frequency of Social Gatherings with Friends and Family: Twice a week    Attends Religious Services: Never    Database Administrator or Organizations: Yes    Attends Engineer, Structural: More than 4 times per year    Marital Status: Married      Current Outpatient Medications:    ascorbic acid (VITAMIN C) 500 MG tablet, Take 500 mg by mouth daily., Disp: , Rfl:    atorvastatin  (LIPITOR) 20 MG tablet, TAKE 1 TABLET DAILY, Disp: 90 tablet, Rfl: 3   celecoxib (CELEBREX) 200 MG capsule, Take 200 mg by mouth 2 (two) times daily., Disp: , Rfl:    diclofenac  Sodium (VOLTAREN ) 1 % GEL, Apply 2 g topically 4 (four) times daily as needed., Disp: 100 g, Rfl: 3   famotidine  (PEPCID )  40 MG tablet, Take 1 tablet (40 mg total) by mouth daily., Disp: 90 tablet, Rfl: 3   Lancets (ONETOUCH ULTRASOFT) lancets, 1 each by Other route as needed., Disp: 100 each, Rfl: 2   ONETOUCH ULTRA test strip, USE AND DISCARD 1 TEST     STRIP AS NEEDED, Disp: 100 strip, Rfl: 2   Simethicone  Extra Strength 125 MG CAPS, Take 1-2 capsules by mouth every 6 (six) hours as needed., Disp: 120 capsule, Rfl: 3   traZODone  (DESYREL ) 100 MG tablet, TAKE 1 TABLET AT BEDTIME, Disp: 90 tablet, Rfl: 3   valsartan  (DIOVAN ) 80 MG tablet, TAKE 1 TABLET DAILY, Disp: 90 tablet, Rfl: 3   venlafaxine  XR (EFFEXOR -XR) 75 MG 24 hr capsule, TAKE 1 CAPSULE BY MOUTH DAILY WITH BREAKFAST.,  Disp: 90 capsule, Rfl: 2   Physical Exam:   BP (!) 146/70   Pulse 68   Temp 97.7 F (36.5 C)   Ht 5' 2 (1.575 m)   Wt 155 lb (70.3 kg)   SpO2 94%   BMI 28.35 kg/m   Pertinent Findings  CN II-XII grossly intact Bilateral EAC clear and TM intact with well pneumatized middle ear spaces Weber 512: equal Rinne 512: AC > BC b/l  Anterior rhinoscopy: Septum midline; bilateral inferior turbinates with no hypertrophy No lesions of oral cavity/oropharynx; dentition wnl No obviously palpable neck masses/lymphadenopathy/thyromegaly No respiratory distress or stridor   Seprately Identifiable Procedures:  None  Impression & Plans:  Misty Mason is a 78 y.o. female with the following   Assessment and Plan     Balance issues-  Several year history of difficulty with balance.  This is not associated with her separate episodes of vertigo.  She has no vertiginous symptoms with her balance issues.  They are only with physical tasks.  I agree with recommendation for physical therapy for this as I have low suspicion  this is an ear related issues contributing to her balance.  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  These are brief and resolve without significant intervention.  She may benefit from meclizine as needed as well as physical therapy will likely be helpful.  I am happy to see her back in the office in the future with any future questions or concerns she may have. .  Bilateral sensorineural hearing loss with tinnitus Longstanding bilateral sensorineural hearing loss with tinnitus, stable and symmetric. Hearing aids provide tinnitus relief. - Continue current hearing aid use for tinnitus management.           - f/u prn   Thank you for allowing me the opportunity to care for your patient. Please do not hesitate to contact me should you have any other questions.  Sincerely, Chyrl Cohen PA-C [facility] Phone: [phone] Fax:  [phone]  01/05/2024, 2:05 PM

## 2024-01-05 NOTE — Progress Notes (Signed)
 Patient is having BP managed.

## 2024-01-07 ENCOUNTER — Encounter: Payer: Self-pay | Admitting: Family Medicine

## 2024-01-09 ENCOUNTER — Ambulatory Visit: Payer: Self-pay

## 2024-01-09 NOTE — Telephone Encounter (Signed)
 Left VM to rtn call to scheduled an earlier appt. (Week after Christmas). Dm/cma

## 2024-01-09 NOTE — Telephone Encounter (Signed)
 FYI Only or Action Required?: FYI only for provider: appointment scheduled on 01/11/2024 at 4pm with her PCP Dr Garnette Simpler.  Patient was last seen in primary care on 10/26/2023 by Simpler Garnette HERO, MD.  Called Nurse Triage reporting Hypertension.  Symptoms began about a month ago.  Interventions attempted: Prescription medications: Valsartan --.  Symptoms are: variable--pt just changed to two Valsartan  a day as per her PCP.  Triage Disposition: See PCP Within 2 Weeks  Patient/caregiver understands and will follow disposition?: Yes             Copied from CRM [id number]. Topic: Clinical - Red Word Triage >> Jan 09, 2024  9:14 AM Suzen RAMAN wrote: Red Word that prompted transfer to Nurse Triage: ELEVATED BP 155/85 PULSE RATE 102; requesting sooner appt Reason for Disposition  [1] Systolic BP >= 130 OR Diastolic >= 80 AND [2] taking BP medications  Answer Assessment - Initial Assessment Questions Over the past month, blood pressure has increased Now averages 150s over 90s and heart rate can get up to around 120 at times.  Patient states that a week or so ago she had some tightness in her chest, got up and ate some Tums--she states she has a history of acid reflux ---it did ease off --she has not felt that since then  135/89 with heart rate of 91 & then she took two Valsartan  as instructed by her PCP Dr Garnette Simpler in a MyChart message 114/68 with pulse of 91 at 9:40am during triage with this RN  Patient is advised to bring her blood pressure cuff with her from home Patient is offered an appointment today at 11am, tomorrow (01/10/2024) at 11am---but declined them both stating she had other appointments and was helping her husband to his appointment Patient denies any symptoms at this time Patient dids ay she was concerned due to a family history of cardiac problems  Patient is advised to call us  back if anything changes or with any further questions/concerns. Patient is  advised that if anything worsens to go to the Emergency Room. Patient verbalized understanding.  Protocols used: Blood Pressure - High-A-AH

## 2024-01-11 ENCOUNTER — Ambulatory Visit: Admitting: Family Medicine

## 2024-01-11 ENCOUNTER — Encounter: Payer: Self-pay | Admitting: Family Medicine

## 2024-01-11 VITALS — BP 134/72 | HR 101 | Temp 98.1°F | Ht 62.0 in | Wt 163.8 lb

## 2024-01-11 DIAGNOSIS — R2689 Other abnormalities of gait and mobility: Secondary | ICD-10-CM

## 2024-01-11 DIAGNOSIS — M19041 Primary osteoarthritis, right hand: Secondary | ICD-10-CM | POA: Diagnosis not present

## 2024-01-11 DIAGNOSIS — I10 Essential (primary) hypertension: Secondary | ICD-10-CM | POA: Diagnosis not present

## 2024-01-11 DIAGNOSIS — M19042 Primary osteoarthritis, left hand: Secondary | ICD-10-CM | POA: Diagnosis not present

## 2024-01-11 MED ORDER — VALSARTAN-HYDROCHLOROTHIAZIDE 160-12.5 MG PO TABS: 1.0000 | ORAL_TABLET | Freq: Every day | ORAL | 3 refills | Status: DC

## 2024-01-11 MED ORDER — MELOXICAM 7.5 MG PO TABS: 7.5000 mg | ORAL_TABLET | Freq: Every day | ORAL | 0 refills | Status: AC

## 2024-01-11 NOTE — Assessment & Plan Note (Signed)
 I will try referring her for physical therapy.

## 2024-01-11 NOTE — Assessment & Plan Note (Signed)
 Reviewed management of OA pain in the hand. Recommend Tylenol Arthritis as first line, along with physical measures such as hot soaks or paraffin baths. Voltaren  gel use is a good option. I will provide some meloxicam for her to use when pain is not controlled by other measures.

## 2024-01-11 NOTE — Progress Notes (Signed)
 [facility] [address] [place] [address] Dept: [phone] Dept Fax: [phone]  Office Visit  Subjective:    Patient ID: Misty Mason, female    DOB: [birth year], 78 y.o..   MRN: [id number]  Chief Complaint  Patient presents with   Hypertension    F/u HTN.  Wants RX for Meloxicam for Arthritis pain in hands.    History of Present Illness:  Misty Mason is in today for elevated blood pressures. On 01/07/2024, she messaged me to explain that she'd noticed her blood pressure had been increasing over the previous month from 130s/80s to 150s/90s. I had discussed that she could increase her valsartan  80 mg to 160 mg daily in the interim until she could be seen in office. She is not aware of any inciting cause for this. She has continued ot monitor her BP at home. Her systolic BP has continued to generally be in the 140-170 range. Misty Mason was previously on valsartan -HCTZ, but had been switched to valsartan  alone some months ago.  At her last visit, Misty Mason noted that she has been having an issue with her balance. She finds that this is particularly an issue when walking down stairs. She must have a rail to hold on to, or else she feels she will fall. She is not necessarily noting vertigo, though this has been an issue in the past. 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 Her hearing has not been worsening recently. I had her seen by ENT, but they did not find a specific issue to be treated. They recommended she consider physical therapy.  Misty Mason has a history of osteoarthritis of both hands. She finds the stiffness and joint swelling to be bothersome. She uses Tylenol Arthritis, but this is only minimally effective. She does apply Voltaren  gel which helps some. She asks about using meloxicam.  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   Sebaceous cyst- Left neck 10/07/2022   History of colonic polyps 05/28/2022   Insomnia 05/04/2022   GAD (generalized anxiety disorder) 05/04/2022   DDD (degenerative disc disease), lumbosacral 10/04/2019   Sensorineural hearing loss (SNHL) of both ears 11/06/2018   Benign paroxysmal positional vertigo of right ear 11/06/2018   Hyperlipidemia 05/05/2017   History of breast cancer 01/23/2013   Lumbar spondylosis 11/17/2010   Essential hypertension 11/17/2010   Past Surgical History:  Procedure Laterality Date   ABDOMINAL HYSTERECTOMY     COLONOSCOPY     MASTECTOMY Left 11/2007   WRIST SURGERY     Family History  Problem Relation Age of Onset   Heart attack Sister    Heart disease Paternal Aunt    Heart disease Paternal Uncle    Colon cancer Neg Hx    Esophageal cancer Neg Hx    Pancreatic cancer Neg Hx    Stomach cancer Neg Hx    Outpatient Medications Prior to Visit  Medication Sig Dispense Refill   ascorbic acid (VITAMIN C) 500 MG tablet Take 500 mg by mouth daily.     atorvastatin  (LIPITOR) 20 MG tablet TAKE 1 TABLET DAILY 90 tablet 3   celecoxib (CELEBREX) 200  MG capsule Take 200 mg by mouth 2 (two) times daily.     diclofenac  Sodium (VOLTAREN ) 1 % GEL Apply 2 g topically 4 (four) times daily as needed. 100 g 3   famotidine  (PEPCID ) 40 MG tablet Take 1 tablet (40 mg total) by mouth daily. 90 tablet 3   Lancets (ONETOUCH ULTRASOFT) lancets 1 each by Other route as needed. 100 each 2   ONETOUCH ULTRA test strip USE AND DISCARD 1 TEST     STRIP AS NEEDED 100 strip 2   Simethicone  Extra Strength 125 MG CAPS Take 1-2 capsules by mouth every 6 (six) hours as needed. 120 capsule 3    traZODone  (DESYREL ) 100 MG tablet TAKE 1 TABLET AT BEDTIME 90 tablet 3   valsartan  (DIOVAN ) 80 MG tablet TAKE 1 TABLET DAILY 90 tablet 3   venlafaxine  XR (EFFEXOR -XR) 75 MG 24 hr capsule TAKE 1 CAPSULE BY MOUTH DAILY WITH BREAKFAST. 90 capsule 2   No facility-administered medications prior to visit.   No Known Allergies   Objective:   Today's Vitals   01/11/24 1553  BP: 134/72  Pulse: (!) 101  Temp: 98.1 F (36.7 C)  TempSrc: Temporal  SpO2: 98%  Weight: 163 lb 12.8 oz (74.3 kg)  Height: 5' 2 (1.575 m)   Body mass index is 29.96 kg/m.   General: Well developed, well nourished. No acute distress. Extremities: Limitation of finger flexion for some joints. Moderate enlargement of some of the DIP and PIP joints   with some joint deformity. Psych: Alert and oriented. Normal mood and affect.  Health Maintenance Due  Topic Date Due   Hepatitis C Screening  Never done   Zoster Vaccines- Shingrix (1 of 2) Never done   Mammogram  06/04/2023   COVID-19 Vaccine (4 - 2025-26 season) 10/03/2023       Assessment & Plan:   Problem List Items Addressed This Visit       Cardiovascular and Mediastinum   Essential hypertension - Primary   Blood pressure has been steadily increasing over the past month from 130s/80s to 150-170s/90s. I will switch her to valsartan -HCTZ 160-12.5 mg daily. She should continue to monitor her BP and send me an updated log in 2 weeks. I will check a BMP today to assess renal function and make sure her potassium is normal before starting her diuretic.      Relevant Medications   valsartan -hydrochlorothiazide (DIOVAN -HCT) 160-12.5 MG tablet   Other Relevant Orders   Basic metabolic panel with GFR     Musculoskeletal and Integument   Osteoarthritis of hands, bilateral   Reviewed management of OA pain in the hand. Recommend Tylenol Arthritis as first line, along with physical measures such as hot soaks or paraffin baths. Voltaren  gel use is a good option. I  will provide some meloxicam for her to use when pain is not controlled by other measures.      Relevant Medications   meloxicam (MOBIC) 7.5 MG tablet     Other   Balance disorder   I will try referring her for physical therapy.      Relevant Orders   Ambulatory referral to Physical Therapy    Return in about 4 weeks (around 02/08/2024) for Reassessment.    [name], MD  I,[name],acting as a scribe for [name], MD.,have documented all relevant documentation on the behalf of [name], MD.  I, [name], MD, have reviewed all documentation for this visit. The documentation on  01/11/2024 for the exam, diagnosis, procedures, and orders are all accurate and complete.

## 2024-01-11 NOTE — Assessment & Plan Note (Addendum)
 Blood pressure has been steadily increasing over the past month from 130s/80s to 150-170s/90s. I will switch her to valsartan -HCTZ 160-12.5 mg daily. She should continue to monitor her BP and send me an updated log in 2 weeks. I will check a BMP today to assess renal function and make sure her potassium is normal before starting her diuretic.

## 2024-01-12 ENCOUNTER — Ambulatory Visit: Payer: Self-pay | Admitting: Family Medicine

## 2024-01-12 DIAGNOSIS — I10 Essential (primary) hypertension: Secondary | ICD-10-CM

## 2024-01-12 LAB — BASIC METABOLIC PANEL WITH GFR
BUN: 13 mg/dL (ref 6–23)
CO2: 28 meq/L (ref 19–32)
Calcium: 9.9 mg/dL (ref 8.4–10.5)
Chloride: 96 meq/L (ref 96–112)
Creatinine, Ser: 0.77 mg/dL (ref 0.40–1.20)
GFR: 73.35 mL/min (ref >60.00)
Glucose, Bld: 84 mg/dL (ref 70–99)
Potassium: 4.5 meq/L (ref 3.5–5.1)
Sodium: 131 meq/L — ABNORMAL LOW (ref 135–145)

## 2024-01-12 MED ORDER — AMLODIPINE BESYLATE-VALSARTAN 5-160 MG PO TABS: 1.0000 | ORAL_TABLET | Freq: Every day | ORAL | 5 refills | Status: AC

## 2024-01-18 ENCOUNTER — Telehealth: Payer: Self-pay

## 2024-01-18 NOTE — Telephone Encounter (Signed)
 Spoke to patient and she is scheduled for 01/30/24. Dm/cma

## 2024-01-18 NOTE — Telephone Encounter (Signed)
 Copied from CRM [id number]. Topic: Clinical - Medication Question >> Jan 17, 2024 10:26 AM Dedra B wrote: Reason for CRM: Pt returning call regarding medication change. >> Jan 17, 2024  1:23 PM Mercedes MATSU wrote: Patient called in wanting CMA Annalia Metzger to return her call. Patient can be reached at the number on file.

## 2024-01-30 ENCOUNTER — Encounter: Payer: Self-pay | Admitting: Family Medicine

## 2024-01-30 ENCOUNTER — Ambulatory Visit (INDEPENDENT_AMBULATORY_CARE_PROVIDER_SITE_OTHER): Admitting: Family Medicine

## 2024-01-30 VITALS — BP 124/68 | HR 94 | Temp 97.7°F | Ht 62.0 in | Wt 162.8 lb

## 2024-01-30 DIAGNOSIS — E871 Hypo-osmolality and hyponatremia: Secondary | ICD-10-CM

## 2024-01-30 DIAGNOSIS — I10 Essential (primary) hypertension: Secondary | ICD-10-CM

## 2024-01-30 NOTE — Assessment & Plan Note (Signed)
 This may be a secondary hyponatremia. I will reassess this today. If this remains elevated, I will pursue additional labs to determine the underlying cause.

## 2024-01-30 NOTE — Progress Notes (Signed)
 " [facility] [address] [place] [address] Dept: [phone] Dept Fax: [phone]  Office Visit  Subjective:    Patient ID: Misty Mason, female    DOB: 04-08-1945, 78 y.o..   MRN: [id number]  Chief Complaint  Patient presents with   Hypertension    F/u HTN.     History of Present Illness:  [name] is in today for reassessment of hypertension. On 01/07/2024, she messaged me to explain that she'd noticed her blood pressure had been increasing over the previous month from 130s/80s to 150s/90s. When she was seen in office on 01/11/2024, I had added HCTZ back to her regimen, however she did have some mild hyponatremia, so I instead switched her to amlodipine -valsartan  5-60 mg daily. She feels she is tolerating this well. She has been monitoring her BP at home. Her 7-day average has gone from 146/72 to now being 134/72.  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   Sebaceous cyst- Left neck 10/07/2022   History of colonic polyps 05/28/2022   Insomnia 05/04/2022   GAD (generalized anxiety disorder) 05/04/2022   DDD (degenerative disc disease), lumbosacral 10/04/2019   Sensorineural hearing loss (SNHL) of both ears 11/06/2018   Benign paroxysmal positional vertigo of right ear 11/06/2018   Hyperlipidemia 05/05/2017   History of breast cancer 01/23/2013   Lumbar spondylosis 11/17/2010   Essential hypertension 11/17/2010   Past Surgical History:  Procedure Laterality Date   ABDOMINAL HYSTERECTOMY     COLONOSCOPY     MASTECTOMY Left 11/2007   WRIST SURGERY     Family History   Problem Relation Age of Onset   Heart attack Sister    Heart disease Paternal Aunt    Heart disease Paternal Uncle    Colon cancer Neg Hx    Esophageal cancer Neg Hx    Pancreatic cancer Neg Hx    Stomach cancer Neg Hx    Outpatient Medications Prior to Visit  Medication Sig Dispense Refill   amLODipine -valsartan  (EXFORGE ) 5-160 MG tablet Take 1 tablet by mouth daily. 30 tablet 5   ascorbic acid (VITAMIN C) 500 MG tablet Take 500 mg by mouth daily.     atorvastatin  (LIPITOR) 20 MG tablet TAKE 1 TABLET DAILY 90 tablet 3   diclofenac  Sodium (VOLTAREN ) 1 % GEL Apply 2 g topically 4 (four) times daily as needed. 100 g 3   famotidine  (PEPCID ) 40 MG tablet Take 1 tablet (40 mg total) by mouth daily. 90 tablet 3   Lancets (ONETOUCH ULTRASOFT) lancets 1 each by Other route as needed. 100 each 2   meloxicam  (MOBIC ) 7.5 MG tablet Take 1 tablet (7.5 mg total) by mouth daily. 30 tablet 0   ONETOUCH ULTRA test strip USE AND DISCARD 1 TEST     STRIP AS NEEDED 100 strip 2   Simethicone  Extra Strength 125 MG CAPS Take 1-2 capsules by mouth every 6 (six) hours as needed. 120 capsule 3   traZODone  (DESYREL ) 100 MG tablet TAKE 1 TABLET AT BEDTIME 90 tablet 3   venlafaxine  XR (EFFEXOR -XR) 75 MG 24 hr capsule TAKE 1 CAPSULE BY MOUTH DAILY WITH BREAKFAST. 90 capsule 2   No  facility-administered medications prior to visit.   No Known Allergies   Objective:   Today's Vitals   01/30/24 1101 01/30/24 1106  BP: 116/66 124/68  Pulse: 94   Temp: 97.7 F (36.5 C)   TempSrc: Temporal   SpO2: 97%   Weight: 162 lb 12.8 oz (73.8 kg)   Height: 5' 2 (1.575 m)     Body mass index is 29.78 kg/m.   General: Well developed, well nourished. No acute distress. Psych: Alert and oriented. Normal mood and affect.  Health Maintenance Due  Topic Date Due   Hepatitis C Screening  Never done   Zoster Vaccines- Shingrix (1 of 2) Never done   Mammogram  06/04/2023   COVID-19 Vaccine (4 - 2025-26 season)  10/03/2023     Assessment & Plan:   Problem List Items Addressed This Visit       Cardiovascular and Mediastinum   Essential hypertension - Primary   Blood pressure is improved and in acceptable control. Continue amlodipine -valsartan  5-160 mg daily. She should continue to monitor her BP.  She should ;let me know if her pressures are consistently > 140/80.        Other   Hyponatremia   This may be a secondary hyponatremia. I will reassess this today. If this remains elevated, I will pursue additional labs to determine the underlying cause.      Relevant Orders   Basic metabolic panel with GFR     Return in about 3 months (around 04/29/2024) for Reassessment.    [name], MD  I,[name],acting as a scribe for [name], MD.,have documented all relevant documentation on the behalf of [name], MD.  I, [name], MD, have reviewed all documentation for this visit. The documentation on 01/30/2024 for the exam, diagnosis, procedures, and orders are all accurate and complete. "

## 2024-01-30 NOTE — Assessment & Plan Note (Addendum)
 Blood pressure is improved and in acceptable control. Continue amlodipine -valsartan  5-160 mg daily. She should continue to monitor her BP.  She should ;let me know if her pressures are consistently > 140/80.

## 2024-01-30 NOTE — Addendum Note (Signed)
 Addended by: THEDORA GARNETTE HERO on: 01/30/2024 01:07 PM   Modules accepted: Orders

## 2024-02-03 ENCOUNTER — Encounter: Payer: Self-pay | Admitting: Family Medicine

## 2024-02-06 ENCOUNTER — Telehealth: Payer: Self-pay | Admitting: Family Medicine

## 2024-02-06 NOTE — Telephone Encounter (Signed)
"  error  "

## 2024-02-06 NOTE — Telephone Encounter (Signed)
 Can you please and thank you check to see what is going on with the lab results for 01/30/24.  Thanks. Dm/cma

## 2024-02-07 ENCOUNTER — Other Ambulatory Visit

## 2024-02-07 ENCOUNTER — Ambulatory Visit: Payer: Self-pay | Admitting: Family Medicine

## 2024-02-07 DIAGNOSIS — E871 Hypo-osmolality and hyponatremia: Secondary | ICD-10-CM

## 2024-02-07 LAB — BASIC METABOLIC PANEL WITH GFR
BUN: 21 mg/dL (ref 6–23)
CO2: 29 meq/L (ref 19–32)
Calcium: 9.7 mg/dL (ref 8.4–10.5)
Chloride: 99 meq/L (ref 96–112)
Creatinine, Ser: 0.83 mg/dL (ref 0.40–1.20)
GFR: 67 mL/min
Glucose, Bld: 104 mg/dL — ABNORMAL HIGH (ref 70–99)
Potassium: 4.2 meq/L (ref 3.5–5.1)
Sodium: 134 meq/L — ABNORMAL LOW (ref 135–145)

## 2024-02-07 NOTE — Addendum Note (Signed)
 Addended by: ALTO PARODY D on: 02/07/2024 11:29 AM   Modules accepted: Orders

## 2024-02-10 ENCOUNTER — Ambulatory Visit: Payer: Self-pay | Attending: Family Medicine

## 2024-02-10 DIAGNOSIS — M5459 Other low back pain: Secondary | ICD-10-CM | POA: Insufficient documentation

## 2024-02-10 DIAGNOSIS — R2681 Unsteadiness on feet: Secondary | ICD-10-CM | POA: Insufficient documentation

## 2024-02-10 DIAGNOSIS — M6281 Muscle weakness (generalized): Secondary | ICD-10-CM | POA: Insufficient documentation

## 2024-02-10 DIAGNOSIS — R296 Repeated falls: Secondary | ICD-10-CM | POA: Insufficient documentation

## 2024-02-10 DIAGNOSIS — R2689 Other abnormalities of gait and mobility: Secondary | ICD-10-CM | POA: Insufficient documentation

## 2024-02-10 NOTE — Therapy (Addendum)
 " OUTPATIENT PHYSICAL THERAPY LOWER EXTREMITY EVALUATION   Patient Name: Misty Mason MRN: [id number] DOB:01/10/1946, 79 y.o., female Today's Date: 02/10/2024  END OF SESSION:  PT End of Session - 02/10/24 1056     Visit Number 1    Date for Recertification  04/20/24    Authorization Type [employer]    PT Start Time 1100    PT Stop Time 1133    PT Time Calculation (min) 33 min    Activity Tolerance Patient tolerated treatment well    Behavior During Therapy WFL for tasks assessed/performed          Past Medical History:  Diagnosis Date   Arthritis    Back pain    Breast cancer (HCC) 2009   GERD (gastroesophageal reflux disease)    High cholesterol    Hypertension    Osteoporosis    Past Surgical History:  Procedure Laterality Date   ABDOMINAL HYSTERECTOMY     COLONOSCOPY     MASTECTOMY Left 11/2007   WRIST SURGERY     Patient Active Problem List   Diagnosis Date Noted   Balance disorder 10/26/2023   Hyponatremia 07/26/2023   Annual physical exam 07/18/2023   Vitamin B12 deficiency 11/18/2022   Osteoporosis 10/07/2022   GERD (gastroesophageal reflux disease) 10/07/2022   Prediabetes 10/07/2022   Cholelithiasis 10/07/2022   Osteoarthritis of hands, bilateral 10/07/2022   Sebaceous cyst- Left neck 10/07/2022   History of colonic polyps 05/28/2022   Insomnia 05/04/2022   GAD (generalized anxiety disorder) 05/04/2022   DDD (degenerative disc disease), lumbosacral 10/04/2019   Sensorineural hearing loss (SNHL) of both ears 11/06/2018   Benign paroxysmal positional vertigo of right ear 11/06/2018   Hyperlipidemia 05/05/2017   History of breast cancer 01/23/2013   Lumbar spondylosis 11/17/2010   Essential hypertension 11/17/2010    PCP: Thedora   REFERRING PROVIDER: Thedora Garnette HERO, MD  REFERRING DIAG: Thedora Garnette HERO, MD   THERAPY DIAG:  Unsteadiness on feet  Gait instability  Muscle weakness (generalized)  Other low back pain  Repeated  falls  Rationale for Evaluation and Treatment: Rehabilitation  ONSET DATE: Chronic   SUBJECTIVE:   SUBJECTIVE STATEMENT: Pt stated over the past two years she has taken around 3 falls. Pt states she feels unsteady with community ambulation like navigating steps and curbs. Pt reported she feels that her legs are weak and she also has low back pain due to degeneration of discs in her low back. Pt expressed wanting to increase walking ability so that she feels more confident and can be more active again.   PERTINENT HISTORY: See PMH above  PAIN:  Are you having pain? Yes: NPRS scale: No pain currently LBP gets up to 9/10 qt worst  Pain location: low back  Pain description: discomfort  Aggravating factors: Prolonged standing/walking Relieving factors: rest  PRECAUTIONS: None  RED FLAGS: None   WEIGHT BEARING RESTRICTIONS: No  FALLS:  Has patient fallen in last 6 months? None; 3 over the last couple years   LIVING ENVIRONMENT: Lives with: lives with their family and lives with their spouse Lives in: House/apartment Stairs: No Has following equipment at home: None  OCCUPATION: Retired   PLOF: Independent  PATIENT GOALS: To build confidence and quality in walking to be able to be more active   NEXT MD VISIT: Unknown   OBJECTIVE:  Note: Objective measures were completed at Evaluation unless otherwise noted.  DIAGNOSTIC FINDINGS: IMPRESSION: Osteoporosis based on BMD.   Fracture risk is  unknown due to history of bone building therapy.   RECOMMENDATIONS: 1. All patients should optimize calcium  and vitamin D intake.   2. Consider FDA-approved medical therapies in postmenopausal women and men aged 50 years and older, based on the following:   - A hip or vertebral (clinical or morphometric) fracture   - T-score less than or equal to -2.5 and secondary causes have been excluded.   - Low bone mass (T-score between -1.0 and -2.5) and a 10-year probability of a hip  fracture greater than or equal to 3% or a 10-year probability of a major osteoporosis-related fracture greater than or equal to 20% based on the [place] -adapted WHO algorithm.   - Clinician judgment and/or patient preferences may indicate treatment for people with 10-year fracture probabilities above or below these levels   3. Patients with diagnosis of osteoporosis or at high risk for fracture should have regular bone mineral density tests. For patients eligible for Medicare, routine testing is allowed once every 2 years. The testing frequency can be increased to one year for patients who have rapidly progressing disease, those who are receiving or discontinuing medical therapy to restore bone mass, or have additional risk factors.     Electronically Signed   By: Dina  Arceo M.D.   On: 07/13/2023 14:55   COGNITION: Overall cognitive status: Within functional limits for tasks assessed     SENSATION: WFL   POSTURE: No Significant postural limitations   LOWER EXTREMITY ROM:  WNL  LOWER EXTREMITY MMT:  MMT Right eval Left eval  Hip flexion 4/5 4/5  Hip extension    Hip abduction 5/5 5/5  Hip adduction 5/5 5/5  Hip internal rotation    Hip external rotation    Knee flexion 5/5 5/5  Knee extension 4/5 4/5  Ankle dorsiflexion 5/5 5/5  Ankle plantarflexion 4/5 4/5  Ankle inversion    Ankle eversion     (Blank rows = not tested)   FUNCTIONAL TESTS:  BERG balance scale: 41  GAIT: Distance walked: In Clinic  Assistive device utilized: None Level of assistance: Complete Independence Comments: Pt with decreased pace and decreased step length bilaterally                                                                                                                                 TREATMENT DATE: 02/10/24-Eval    PATIENT EDUCATION:  Education details: HEP Person educated: Patient Education method: Medical Illustrator Education comprehension: verbalized  understanding  HOME EXERCISE PROGRAM: Access Code: [email or web address] URL: [email or web address] Date: 02/10/2024 Prepared by: Almetta Fam  Exercises - Supine Bridge  - 1 x daily - 7 x weekly - 2 sets - 10 reps - Seated Knee Extension with Resistance  - 1 x daily - 7 x weekly - 2 sets - 10 reps - Standing March with Counter Support  - 1 x daily - 7 x weekly - 2 sets - 10 reps -  Sit to Stand  - 1 x daily - 7 x weekly - 2 sets - 10 reps - Seated Hamstring Stretch  - 1 x daily - 7 x weekly - 5 reps - 20 hold  ASSESSMENT:  CLINICAL IMPRESSION: Patient is a 79 y.o. female  who was seen today for physical therapy evaluation and treatment for balance/gait deficits. She reports not exercising due to it causing increased pain in her back and legs. She states having difficulty with household chores. Pt presents with decreased balance in static and dynamic balance activities. Pt with difficulty in SL stance activities and required CGA. Pt with decreased strength in quad musculature and will benefit from LE strengthening. Pt presents with low back pain and will also benefit from stretching to mitigate joint stiffness. Pt will benefit from skilled PT to improve gait quality, decrease falls risk, and better navigate the community.   OBJECTIVE IMPAIRMENTS: decreased coordination, decreased endurance, difficulty walking, and decreased strength.   ACTIVITY LIMITATIONS: bending and stairs  PARTICIPATION LIMITATIONS: cleaning and laundry  PERSONAL FACTORS: Age are also affecting patient's functional outcome.   REHAB POTENTIAL: Good  CLINICAL DECISION MAKING: Stable/uncomplicated  EVALUATION COMPLEXITY: Low   GOALS: Goals reviewed with patient? Yes  SHORT TERM GOALS: Target date: 03/17/24  Patient will be independent with initial HEP. Baseline:  Goal status: INITIAL    LONG TERM GOALS: Target date: 04/20/2024   Patient will be educated on strategies to decrease risk of falls.   Baseline:  Goal status: INITIAL  Patient will be independent with advanced/ongoing HEP to improve outcomes and carryover.  Baseline:  Goal status: INITIAL  Patient will be able to complete household chores such as vacuuming and sweeping without pain.  Baseline:  Goal status: INITIAL  Patient will demonstrate improved functional LE strength as demonstrated by 5/5/ MMT in all LE planes. Baseline:  Goal status: INITIAL  Patient will score 49 or better on Berg Balance test to demonstrate lower risk of falls. (MCID= 8 points) .  Baseline: 41 Goal status: INITIAL    PLAN:  PT FREQUENCY: 1-2x/week  PT DURATION: 10 weeks  PLANNED INTERVENTIONS: 97110-Therapeutic exercises, 97530- Therapeutic activity, [id number]- Neuromuscular re-education, 97535- Self Care, [id number]- Manual therapy, [phone]- Gait training, and Patient/Family education  PLAN FOR NEXT SESSION: Static balance and progress to dynamic, LE strengthening   Almetta Fam, PT 02/10/2024, 11:59 AM  "

## 2024-02-13 ENCOUNTER — Ambulatory Visit: Admitting: Family Medicine

## 2024-02-13 ENCOUNTER — Encounter: Payer: Self-pay | Admitting: Family Medicine

## 2024-02-16 ENCOUNTER — Ambulatory Visit (INDEPENDENT_AMBULATORY_CARE_PROVIDER_SITE_OTHER): Admitting: Family Medicine

## 2024-02-16 ENCOUNTER — Ambulatory Visit

## 2024-02-16 VITALS — BP 97/57 | HR 68 | Temp 98.1°F | Ht 62.0 in | Wt 164.8 lb

## 2024-02-16 DIAGNOSIS — J00 Acute nasopharyngitis [common cold]: Secondary | ICD-10-CM

## 2024-02-16 DIAGNOSIS — I952 Hypotension due to drugs: Secondary | ICD-10-CM | POA: Diagnosis not present

## 2024-02-16 MED ORDER — BENZONATATE 200 MG PO CAPS: 200.0000 mg | ORAL_CAPSULE | Freq: Three times a day (TID) | ORAL | 0 refills | Status: AC | PRN

## 2024-02-16 MED ORDER — BENZONATATE 200 MG PO CAPS: 200.0000 mg | ORAL_CAPSULE | Freq: Three times a day (TID) | ORAL | 0 refills | Status: DC | PRN

## 2024-02-16 NOTE — Patient Instructions (Addendum)
 It was very nice to see you today!  VISIT SUMMARY: During your visit, we addressed your persistent cough and concerns about low blood pressure. We made adjustments to your medications and provided new recommendations to help manage your symptoms.  YOUR PLAN: ACUTE COUGH: You have had a persistent cough for about a week, which has caused chest pain and required the use of a back brace. There is no fever, and the cough is likely viral. -Stop taking Tylenol 3 as it has been ineffective and may contribute to low blood pressure. -Stop taking doxycycline as there is no bacterial infection. -Take Tessalon  Perles (benzonatate ) 200 mg up to three times a day as needed for cough. -Use over-the-counter Mucinex DM. -Continue using honey for symptomatic relief.  HYPOTENSION: Your low blood pressure readings at home may be due to Tylenol 3 use and possible dehydration. Recent adjustments to your blood pressure medication may also be a factor. -Hold your blood pressure medication if your systolic blood pressure is under 90 mmHg or diastolic under 60 mmHg. -If your blood pressure is between 90/60 and 140/90 mmHg, take half a tablet of your blood pressure medication. -Monitor your blood pressure at home. -Follow up with your primary care physician, Dr. Thedora, in two to three weeks to reassess your blood pressure management.  Return in about 3 weeks (around 03/08/2024) for BP.   Take care, Arvella Hummer, MD, MS   PLEASE NOTE:  If you had any lab tests, please let us  know if you have not heard back within a few days. You may see your results on mychart before we have a chance to review them but we will give you a call once they are reviewed by us .   If we ordered any referrals today, please let us  know if you have not heard from their office within the next week.   If you had any urgent prescriptions sent in today, please check with the pharmacy within an hour of our visit to make sure the prescription was  transmitted appropriately.   Please try these tips to maintain a healthy lifestyle:  Eat at least 3 REAL meals and 1-2 snacks per day.  Aim for no more than 5 hours between eating.  If you eat breakfast, please do so within one hour of getting up.   Each meal should contain half fruits/vegetables, one quarter protein, and one quarter carbs (no bigger than a computer mouse)  Cut down on sweet beverages. This includes juice, soda, and sweet tea.   Drink at least 1 glass of water with each meal and aim for at least 8 glasses per day  Exercise at least 150 minutes every week.

## 2024-02-16 NOTE — Progress Notes (Signed)
 " Assessment & Plan   Assessment/Plan:     Assessment & Plan Acute nasopharyngitis Ongoing for one week, slightly improved today. Likely viral in etiology, no fever present. Coughing has caused chest pain. Tylenol 3 has been ineffective and may contribute to hypotension. Doxycycline is not indicated as there is no bacterial infection. - Discontinued Tylenol 3. - Discontinued doxycycline. - Prescribed Tessalon  Perles (benzonatate ) 200 mg up to three times a day as needed for cough. - Recommended over-the-counter Mucinex DM. - Continue using honey for symptomatic relief.  Hypotension Likely secondary to Tylenol 3 use and possible dehydration. Blood pressure readings at home have been low, with some as low as 100/55 mmHg. Recent adjustment of blood pressure medication may also contribute. - Hold blood pressure medication if systolic blood pressure is under 90 mmHg or diastolic under 60 mmHg. - If blood pressure is between 90/60 and 140/90 mmHg, take half a tablet of blood pressure medication. - Monitor blood pressure at home. - Follow up with primary care physician, Dr. Thedora, in two to three weeks to reassess blood pressure management.        Medications Discontinued During This Encounter  Medication Reason   benzonatate  (TESSALON ) 200 MG capsule     Return in about 3 weeks (around 03/08/2024) for BP.        Subjective:   Encounter date: 02/16/2024  Misty Mason is a 79 y.o. female who has Sensorineural hearing loss (SNHL) of both ears; Lumbar spondylosis; Insomnia; Hyperlipidemia; Essential hypertension; History of colonic polyps; GAD (generalized anxiety disorder); DDD (degenerative disc disease), lumbosacral; History of breast cancer; Benign paroxysmal positional vertigo of right ear; Osteoporosis; GERD (gastroesophageal reflux disease); Prediabetes; Cholelithiasis; Osteoarthritis of hands, bilateral; Sebaceous cyst- Left neck; Vitamin B12 deficiency; Annual physical exam;  Hyponatremia; and Balance disorder on their problem list..   She  has a past medical history of Arthritis, Back pain, Breast cancer (HCC) (2009), GERD (gastroesophageal reflux disease), High cholesterol, Hypertension, and Osteoporosis..   She presents with chief complaint of Acute Visit (Pt present today with a severe cough. Pt States the cough has been going on since a week ago. Pt state she feels like she pulled a muscle while coughing ) .   Discussed the use of AI scribe software for clinical note transcription with the patient, who gave verbal consent to proceed.  History of Present Illness Misty Mason is a 79 year old female with hypertension who presents with a persistent cough and concerns about low blood pressure.  Cough - Persistent cough for approximately one week, initially worsening but slightly improved today - Cough is severe enough to cause musculoskeletal pain, requiring use of a back brace for compression - No shortness of breath, dizziness, or chest pain except when coughing - No fever reported - Concerned about possible contagion, but no fever present - Using Tylenol 3 for cough relief, effectiveness uncertain - Started doxycycline 100 mg independently until able to see a physician - Using homemade remedy with alcohol and honey, which provides some relief - Requests effective cough medicine  Blood pressure abnormalities - Low blood pressure readings at home, such as 100/55 - Blood pressure medication adjusted approximately three weeks ago due to previous high readings and pulse of 102 - Not consistently monitoring blood pressure at home recently - Questions whether Tylenol 3 could be affecting blood pressure     ROS  Past Surgical History:  Procedure Laterality Date   ABDOMINAL HYSTERECTOMY     COLONOSCOPY  MASTECTOMY Left 11/2007   WRIST SURGERY      Outpatient Medications Prior to Visit  Medication Sig Dispense Refill   amLODipine -valsartan   (EXFORGE ) 5-160 MG tablet Take 1 tablet by mouth daily. 30 tablet 5   ascorbic acid (VITAMIN C) 500 MG tablet Take 500 mg by mouth daily.     atorvastatin  (LIPITOR) 20 MG tablet TAKE 1 TABLET DAILY 90 tablet 3   diclofenac  Sodium (VOLTAREN ) 1 % GEL Apply 2 g topically 4 (four) times daily as needed. 100 g 3   famotidine  (PEPCID ) 40 MG tablet Take 1 tablet (40 mg total) by mouth daily. 90 tablet 3   Lancets (ONETOUCH ULTRASOFT) lancets 1 each by Other route as needed. 100 each 2   meloxicam  (MOBIC ) 7.5 MG tablet Take 1 tablet (7.5 mg total) by mouth daily. 30 tablet 0   ONETOUCH ULTRA test strip USE AND DISCARD 1 TEST     STRIP AS NEEDED 100 strip 2   Simethicone  Extra Strength 125 MG CAPS Take 1-2 capsules by mouth every 6 (six) hours as needed. 120 capsule 3   traZODone  (DESYREL ) 100 MG tablet TAKE 1 TABLET AT BEDTIME 90 tablet 3   No facility-administered medications prior to visit.    Family History  Problem Relation Age of Onset   Heart attack Sister    Heart disease Paternal Aunt    Heart disease Paternal Uncle    Colon cancer Neg Hx    Esophageal cancer Neg Hx    Pancreatic cancer Neg Hx    Stomach cancer Neg Hx     Social History   Socioeconomic History   Marital status: Married    Spouse name: Not on file   Number of children: 2   Years of education: Not on file   Highest education level: Associate degree: occupational, scientist, product/process development, or vocational program  Occupational History   Not on file  Tobacco Use   Smoking status: Never   Smokeless tobacco: Never  Vaping Use   Vaping status: Never Used  Substance and Sexual Activity   Alcohol use: Yes    Comment: drinks 2 glasses of wine q hs   Drug use: Never   Sexual activity: Not Currently  Other Topics Concern   Not on file  Social History Narrative   Not on file   Social Drivers of Health   Tobacco Use: Low Risk (02/10/2024)   Patient History    Smoking Tobacco Use: Never    Smokeless Tobacco Use: Never     Passive Exposure: Not on file  Financial Resource Strain: Low Risk (06/03/2023)   Overall Financial Resource Strain (CARDIA)    Difficulty of Paying Living Expenses: Not hard at all  Food Insecurity: No Food Insecurity (06/03/2023)   Hunger Vital Sign    Worried About Running Out of Food in the Last Year: Never true    Ran Out of Food in the Last Year: Never true  Transportation Needs: No Transportation Needs (06/03/2023)   PRAPARE - Administrator, Civil Service (Medical): No    Lack of Transportation (Non-Medical): No  Physical Activity: Inactive (06/03/2023)   Exercise Vital Sign    Days of Exercise per Week: 0 days    Minutes of Exercise per Session: 0 min  Stress: Stress Concern Present (06/03/2023)   [employer] of Occupational Health - Occupational Stress Questionnaire    Feeling of Stress : To some extent  Social Connections: Moderately Integrated (06/03/2023)   Social Connection and  Isolation Panel    Frequency of Communication with Friends and Family: More than three times a week    Frequency of Social Gatherings with Friends and Family: Twice a week    Attends Religious Services: Never    Database Administrator or Organizations: Yes    Attends Engineer, Structural: More than 4 times per year    Marital Status: Married  Catering Manager Violence: Not At Risk (06/03/2023)   Humiliation, Afraid, Rape, and Kick questionnaire    Fear of Current or Ex-Partner: No    Emotionally Abused: No    Physically Abused: No    Sexually Abused: No  Depression (PHQ2-9): Low Risk (02/16/2024)   Depression (PHQ2-9)    PHQ-2 Score: 0  Alcohol Screen: Low Risk (06/03/2023)   Alcohol Screen    Last Alcohol Screening Score (AUDIT): 4  Housing: Unknown (06/03/2023)   Housing Stability Vital Sign    Unable to Pay for Housing in the Last Year: No    Number of Times Moved in the Last Year: Not on file    Homeless in the Last Year: No  Utilities: Not At Risk (06/03/2023)   [facility]  Utilities    Threatened with loss of utilities: No  Health Literacy: Adequate Health Literacy (06/03/2023)   B1300 Health Literacy    Frequency of need for help with medical instructions: Never                                                                                                  Objective:  Physical Exam: BP (!) 97/57   Pulse 68   Temp 98.1 F (36.7 C)   Ht 5' 2 (1.575 m)   Wt 164 lb 12.8 oz (74.8 kg)   SpO2 97%   BMI 30.14 kg/m    Physical Exam GENERAL: Alert, cooperative, well developed, no acute distress. HEENT: Normocephalic, normal oropharynx, moist mucous membranes. CHEST: Clear to auscultation bilaterally, no wheezes, rhonchi, or crackles. CARDIOVASCULAR: Normal heart rate and rhythm, S1 and S2 normal without murmurs. ABDOMEN: Soft, non-tender, non-distended, without organomegaly, normal bowel sounds. EXTREMITIES: No cyanosis or edema. NEUROLOGICAL: Cranial nerves grossly intact, moves all extremities without gross motor or sensory deficit.   Physical Exam  No results found.  Recent Results (from the past 2160 hours)  Basic metabolic panel with GFR     Status: Abnormal   Collection Time: 01/11/24  4:38 PM  Result Value Ref Range   Sodium 131 (L) 135 - 145 mEq/L   Potassium 4.5 3.5 - 5.1 mEq/L   Chloride 96 96 - 112 mEq/L   CO2 28 19 - 32 mEq/L    Comment: Elevated LDH levels may cause falsely increased CO2 results. If LDH is >2000 U/L, a positive bias of 12% is possible.   Glucose, Bld 84 70 - 99 mg/dL   BUN 13 6 - 23 mg/dL   Creatinine, Ser 9.22 0.40 - 1.20 mg/dL   GFR 26.64 >39.99 mL/min    Comment: Calculated using the CKD-EPI Creatinine Equation (2021)   Calcium  9.9 8.4 - 10.5 mg/dL  Basic metabolic  panel with GFR     Status: Abnormal   Collection Time: 02/07/24 11:29 AM  Result Value Ref Range   Sodium 134 (L) 135 - 145 mEq/L   Potassium 4.2 3.5 - 5.1 mEq/L   Chloride 99 96 - 112 mEq/L   CO2 29 19 - 32 mEq/L    Comment: Elevated LDH  levels may cause falsely increased CO2 results. If LDH is >2000 U/L, a positive bias of 12% is possible.   Glucose, Bld 104 (H) 70 - 99 mg/dL   BUN 21 6 - 23 mg/dL   Creatinine, Ser 9.16 0.40 - 1.20 mg/dL   GFR 32.99 >39.99 mL/min    Comment: Calculated using the CKD-EPI Creatinine Equation (2021)   Calcium  9.7 8.4 - 10.5 mg/dL        Beverley Adine Hummer, MD, MS "

## 2024-02-23 ENCOUNTER — Ambulatory Visit

## 2024-02-23 DIAGNOSIS — R296 Repeated falls: Secondary | ICD-10-CM

## 2024-02-23 DIAGNOSIS — R2681 Unsteadiness on feet: Secondary | ICD-10-CM | POA: Diagnosis not present

## 2024-02-23 DIAGNOSIS — M6281 Muscle weakness (generalized): Secondary | ICD-10-CM

## 2024-02-23 DIAGNOSIS — M5459 Other low back pain: Secondary | ICD-10-CM

## 2024-02-23 NOTE — Therapy (Signed)
 " OUTPATIENT PHYSICAL THERAPY LOWER EXTREMITY TREATMENT   Patient Name: Misty Mason MRN: [id number] DOB:[birth year], 79 y.o., female Today's Date: 02/23/2024  END OF SESSION:  PT End of Session - 02/23/24 1147     Visit Number 2    Date for Recertification  04/20/24    Authorization Type [employer]    PT Start Time 1145    PT Stop Time 1230    PT Time Calculation (min) 45 min    Activity Tolerance Patient tolerated treatment well    Behavior During Therapy [facility] for tasks assessed/performed         Past Medical History:  Diagnosis Date   Arthritis    Back pain    Breast cancer (HCC) 2009   GERD (gastroesophageal reflux disease)    High cholesterol    Hypertension    Osteoporosis    Past Surgical History:  Procedure Laterality Date   ABDOMINAL HYSTERECTOMY     COLONOSCOPY     MASTECTOMY Left 11/2007   WRIST SURGERY     Patient Active Problem List   Diagnosis Date Noted   Balance disorder 10/26/2023   Hyponatremia 07/26/2023   Annual physical exam 07/18/2023   Vitamin B12 deficiency 11/18/2022   Osteoporosis 10/07/2022   GERD (gastroesophageal reflux disease) 10/07/2022   Prediabetes 10/07/2022   Cholelithiasis 10/07/2022   Osteoarthritis of hands, bilateral 10/07/2022   Sebaceous cyst- Left neck 10/07/2022   History of colonic polyps 05/28/2022   Insomnia 05/04/2022   GAD (generalized anxiety disorder) 05/04/2022   DDD (degenerative disc disease), lumbosacral 10/04/2019   Sensorineural hearing loss (SNHL) of both ears 11/06/2018   Benign paroxysmal positional vertigo of right ear 11/06/2018   Hyperlipidemia 05/05/2017   History of breast cancer 01/23/2013   Lumbar spondylosis 11/17/2010   Essential hypertension 11/17/2010    PCP: Thedora   REFERRING PROVIDER: Thedora Garnette HERO, MD  REFERRING DIAG: Thedora Garnette HERO, MD   THERAPY DIAG:  Unsteadiness on feet  Other low back pain  Repeated falls  Gait instability  Muscle weakness  (generalized)  Rationale for Evaluation and Treatment: Rehabilitation  ONSET DATE: Chronic   SUBJECTIVE:   SUBJECTIVE STATEMENT: Pt reported she is recovering from being sick with bronchitis so she hasn't been able to do much of her HEP but overall feels okay today. Pt reported no recent falls.    Eval: Pt stated over the past two years she has taken around 3 falls. Pt states she feels unsteady with community ambulation like navigating steps and curbs. Pt reported she feels that her legs are weak and she also has low back pain due to degeneration of discs in her low back. Pt expressed wanting to increase walking ability so that she feels more confident and can be more active again.   PERTINENT HISTORY: See PMH above  PAIN:  Are you having pain? Yes: NPRS scale: No pain currently LBP gets up to 9/10 qt worst  Pain location: low back  Pain description: discomfort  Aggravating factors: Prolonged standing/walking Relieving factors: rest  PRECAUTIONS: None  RED FLAGS: None   WEIGHT BEARING RESTRICTIONS: No  FALLS:  Has patient fallen in last 6 months? None; 3 over the last couple years   LIVING ENVIRONMENT: Lives with: lives with their family and lives with their spouse Lives in: House/apartment Stairs: No Has following equipment at home: None  OCCUPATION: Retired   PLOF: Independent  PATIENT GOALS: To build confidence and quality in walking to be able to be more  active   NEXT MD VISIT: Unknown   OBJECTIVE:  Note: Objective measures were completed at Evaluation unless otherwise noted.  DIAGNOSTIC FINDINGS: IMPRESSION: Osteoporosis based on BMD.   Fracture risk is unknown due to history of bone building therapy.   RECOMMENDATIONS: 1. All patients should optimize calcium  and vitamin D intake.   2. Consider FDA-approved medical therapies in postmenopausal women and men aged 4 years and older, based on the following:   - A hip or vertebral (clinical or  morphometric) fracture   - T-score less than or equal to -2.5 and secondary causes have been excluded.   - Low bone mass (T-score between -1.0 and -2.5) and a 10-year probability of a hip fracture greater than or equal to 3% or a 10-year probability of a major osteoporosis-related fracture greater than or equal to 20% based on the [place] -adapted WHO algorithm.   - Clinician judgment and/or patient preferences may indicate treatment for people with 10-year fracture probabilities above or below these levels   3. Patients with diagnosis of osteoporosis or at high risk for fracture should have regular bone mineral density tests. For patients eligible for Medicare, routine testing is allowed once every 2 years. The testing frequency can be increased to one year for patients who have rapidly progressing disease, those who are receiving or discontinuing medical therapy to restore bone mass, or have additional risk factors.     Electronically Signed   By: Dina  Arceo M.D.   On: 07/13/2023 14:55   COGNITION: Overall cognitive status: Within functional limits for tasks assessed     SENSATION: WFL   POSTURE: No Significant postural limitations   LOWER EXTREMITY ROM:  WNL  LOWER EXTREMITY MMT:  MMT Right eval Left eval  Hip flexion 4/5 4/5  Hip extension    Hip abduction 5/5 5/5  Hip adduction 5/5 5/5  Hip internal rotation    Hip external rotation    Knee flexion 5/5 5/5  Knee extension 4/5 4/5  Ankle dorsiflexion 5/5 5/5  Ankle plantarflexion 4/5 4/5  Ankle inversion    Ankle eversion     (Blank rows = not tested)   FUNCTIONAL TESTS:  BERG balance scale: 41  GAIT: Distance walked: In Clinic  Assistive device utilized: None Level of assistance: Complete Independence Comments: Pt with decreased pace and decreased step length bilaterally                                                                                                                                  TREATMENT DATE:  02/23/24 NuStep L5 x 6 min  Step Ups 6 no railing use (CGA) Lateral step overs on airex [place] Taps on Airex (CGA to minA)  Resisted gait 10# forward/backward and lateral CGA to minA HS Curls 2x10 Blue Tband Leg ext 5#s 2x10 Glute Bridges 2x10 SLR 2x10 Lumbar rotation stretch  HS stretch Knee to chest stretch   02/10/24-Eval  PATIENT EDUCATION:  Education details: HEP Person educated: Patient Education method: Medical Illustrator Education comprehension: verbalized understanding  HOME EXERCISE PROGRAM: Access Code: [email or web address] URL: [email or web address] Date: 02/10/2024 Prepared by: Almetta Fam  Exercises - Supine Bridge  - 1 x daily - 7 x weekly - 2 sets - 10 reps - Seated Knee Extension with Resistance  - 1 x daily - 7 x weekly - 2 sets - 10 reps - Standing March with Counter Support  - 1 x daily - 7 x weekly - 2 sets - 10 reps - Sit to Stand  - 1 x daily - 7 x weekly - 2 sets - 10 reps - Seated Hamstring Stretch  - 1 x daily - 7 x weekly - 5 reps - 20 hold  ASSESSMENT:  CLINICAL IMPRESSION: Pt tolerated session well with no increase in pain symptoms. Session focused on dynamic balance activities, LE strengthening, and stretching to reduce LBP. Pt required CGA to minA today with dynamic balance activities with a tendency to weight shift backward when losing balance. Pt will continue to benefit from skilled PT in order to increase fall prevention strategies, increase strength, and reduce LBP.   Patient is a 79 y.o. female  who was seen today for physical therapy evaluation and treatment for balance/gait deficits. She reports not exercising due to it causing increased pain in her back and legs. She states having difficulty with household chores. Pt presents with decreased balance in static and dynamic balance activities. Pt with difficulty in SL stance activities and required CGA. Pt with decreased strength in quad musculature and  will benefit from LE strengthening. Pt presents with low back pain and will also benefit from stretching to mitigate joint stiffness. Pt will benefit from skilled PT to improve gait quality, decrease falls risk, and better navigate the community.   OBJECTIVE IMPAIRMENTS: decreased coordination, decreased endurance, difficulty walking, and decreased strength.   ACTIVITY LIMITATIONS: bending and stairs  PARTICIPATION LIMITATIONS: cleaning and laundry  PERSONAL FACTORS: Age are also affecting patient's functional outcome.   REHAB POTENTIAL: Good  CLINICAL DECISION MAKING: Stable/uncomplicated  EVALUATION COMPLEXITY: Low   GOALS: Goals reviewed with patient? Yes  SHORT TERM GOALS: Target date: 03/17/24  Patient will be independent with initial HEP. Baseline:  Goal status: IN PROGRESS <50% doing 02/23/24    LONG TERM GOALS: Target date: 04/20/2024   Patient will be educated on strategies to decrease risk of falls.  Baseline:  Goal status: IN PROGRESS 02/23/24  Patient will be independent with advanced/ongoing HEP to improve outcomes and carryover.  Baseline:  Goal status: IN PROGRESS 02/23/24  Patient will be able to complete household chores such as vacuuming and sweeping without pain.  Baseline:  Goal status: IN PROGRESS pain with prolonged activity 02/23/24  Patient will demonstrate improved functional LE strength as demonstrated by 5/5/ MMT in all LE planes. Baseline:  Goal status: IN PROGRESS 02/23/24  Patient will score 49 or better on Berg Balance test to demonstrate lower risk of falls. (MCID= 8 points) .  Baseline: 41 Goal status: INITIAL    PLAN:  PT FREQUENCY: 1-2x/week  PT DURATION: 10 weeks  PLANNED INTERVENTIONS: 97110-Therapeutic exercises, 97530- Therapeutic activity, 97112- Neuromuscular re-education, 97535- Self Care, [id number]- Manual therapy, [phone]- Gait training, and Patient/Family education  PLAN FOR NEXT SESSION: Static balance and progress to  dynamic, LE strengthening  Thersia Alder, Student-PT 02/23/2024, 12:39 PM  "

## 2024-02-27 ENCOUNTER — Ambulatory Visit: Admitting: Family Medicine

## 2024-03-01 ENCOUNTER — Ambulatory Visit

## 2024-03-08 ENCOUNTER — Ambulatory Visit: Admitting: Physical Therapy

## 2024-03-09 ENCOUNTER — Encounter: Payer: Self-pay | Admitting: Family Medicine

## 2024-03-09 ENCOUNTER — Ambulatory Visit: Admitting: Family Medicine

## 2024-03-09 ENCOUNTER — Other Ambulatory Visit: Payer: Self-pay | Admitting: Family Medicine

## 2024-03-09 VITALS — BP 118/66 | HR 81 | Temp 97.6°F | Ht 62.0 in | Wt 166.2 lb

## 2024-03-09 DIAGNOSIS — E66811 Obesity, class 1: Secondary | ICD-10-CM

## 2024-03-09 DIAGNOSIS — I10 Essential (primary) hypertension: Secondary | ICD-10-CM

## 2024-03-09 DIAGNOSIS — E871 Hypo-osmolality and hyponatremia: Secondary | ICD-10-CM

## 2024-03-09 MED ORDER — WEGOVY 0.25 MG/0.5ML ~~LOC~~ SOAJ: 0.2500 mg | SUBCUTANEOUS | 0 refills | Status: AC

## 2024-03-09 NOTE — Assessment & Plan Note (Signed)
 MS. Sumemrs has Class 1 obesity complicated by hypertension, prediabetes, and lumbar disc disease. Ms. Hefferan is concerned with weight gain over time and inability to lose this with diet and exercise approaches. She contacted her insurer, who told her they could not tell her what her co-pay might be for this. They advised ehr to have her provider prescribe the medication and have a PA run. She would benefit from weight loss to improve her co-morbid conditions.

## 2024-03-09 NOTE — Progress Notes (Signed)
 " [facility] [address] [place] [address] Dept: [phone] Dept Fax: [phone]  Office Visit  Subjective:    Patient ID: Misty Mason, female    DOB: 01/18/1946, 79 y.o..   MRN: [id number]  Chief Complaint  Patient presents with   Hypertension    3 week f/u HTN.     History of Present Illness:  Patient is in today for follow up on hypotension.  Misty Mason has a history of hypertension. On 01/07/2024, she messaged me to explain that she'd noticed her blood pressure had been increasing over the previous month from 130s/80s to 150s/90s. When she was seen in office on 01/11/2024, I had added HCTZ back to her regimen, however she did have some mild hyponatremia, so I instead switched her to amlodipine -valsartan  5-160 mg daily. She has been monitoring her BP at home. Her 7-day average has gone from 146/72 down to 134/72.   Misty Mason was seen by Dr. Sebastian on 02/16/2024 for acute nasopharyngitis. She noted her home blood pressures had been low, down to 100/55. Dr. Sebastian had advised her to stop her Exforge . She notes that her blood pressures started to go back up into the 140s after this. She restarted her Exforge , but taking 1/2 tablet daily.  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   Sebaceous cyst- Left neck 10/07/2022   History of colonic polyps 05/28/2022   Insomnia 05/04/2022   GAD (generalized anxiety disorder) 05/04/2022   DDD (degenerative disc disease), lumbosacral 10/04/2019   Sensorineural hearing loss (SNHL) of both ears 11/06/2018   Benign paroxysmal positional  vertigo of right ear 11/06/2018   Hyperlipidemia 05/05/2017   History of breast cancer 01/23/2013   Lumbar spondylosis 11/17/2010   Essential hypertension 11/17/2010   Past Surgical History:  Procedure Laterality Date   ABDOMINAL HYSTERECTOMY     COLONOSCOPY     MASTECTOMY Left 11/2007   WRIST SURGERY     Family History  Problem Relation Age of Onset   Heart attack Sister    Heart disease Paternal Aunt    Heart disease Paternal Uncle    Colon cancer Neg Hx    Esophageal cancer Neg Hx    Pancreatic cancer Neg Hx    Stomach cancer Neg Hx    Outpatient Medications Prior to Visit  Medication Sig Dispense Refill   amLODipine -valsartan  (EXFORGE ) 5-160 MG tablet Take 1 tablet by mouth daily. 30 tablet 5   ascorbic acid (VITAMIN C) 500 MG tablet Take 500 mg by mouth daily.     atorvastatin  (LIPITOR) 20 MG tablet TAKE 1 TABLET DAILY 90 tablet 3   benzonatate  (TESSALON ) 200 MG capsule Take 1 capsule (200 mg total) by mouth 3 (three) times daily as needed for cough. 60 capsule 0   diclofenac  Sodium (VOLTAREN ) 1 % GEL Apply 2 g topically 4 (four) times daily as needed. 100 g 3   famotidine  (PEPCID ) 40 MG tablet Take 1 tablet (40 mg total) by mouth daily. 90 tablet 3   Lancets (ONETOUCH ULTRASOFT) lancets 1 each by Other route as needed. 100 each 2   meloxicam  (MOBIC ) 7.5 MG tablet Take 1 tablet (7.5 mg total)  by mouth daily. 30 tablet 0   ONETOUCH ULTRA test strip USE AND DISCARD 1 TEST     STRIP AS NEEDED 100 strip 2   Simethicone  Extra Strength 125 MG CAPS Take 1-2 capsules by mouth every 6 (six) hours as needed. 120 capsule 3   traZODone  (DESYREL ) 100 MG tablet TAKE 1 TABLET AT BEDTIME 90 tablet 3   No facility-administered medications prior to visit.   Allergies[1]   Objective:   Today's Vitals   03/09/24 1255  BP: 118/66  Pulse: 81  Temp: 97.6 F (36.4 C)  TempSrc: Temporal  SpO2: 98%  Weight: 166 lb 3.2 oz (75.4 kg)  Height: 5' 2 (1.575 m)   Body mass index is 30.4  kg/m.   General: Well developed, well nourished. No acute distress. Psych: Alert and oriented. Normal mood and affect.  Health Maintenance Due  Topic Date Due   Hepatitis C Screening  Never done   Zoster Vaccines- Shingrix (1 of 2) Never done   Mammogram  06/04/2023   COVID-19 Vaccine (4 - 2025-26 season) 10/03/2023   Lab Results    Latest Ref Rng & Units 02/07/2024   11:29 AM 01/11/2024    4:38 PM 07/26/2023    9:22 AM  CMP  Glucose 70 - 99 mg/dL 895  84  92   BUN 6 - 23 mg/dL 21  13  16    Creatinine 0.40 - 1.20 mg/dL 9.16  9.22  9.32   Sodium 135 - 145 mEq/L 134  131  132   Potassium 3.5 - 5.1 mEq/L 4.2  4.5  4.1   Chloride 96 - 112 mEq/L 99  96  98   CO2 19 - 32 mEq/L 29  28  30    Calcium  8.4 - 10.5 mg/dL 9.7  9.9  9.4   Total Protein 6.0 - 8.3 g/dL   6.9   Total Bilirubin 0.2 - 1.2 mg/dL   0.9   Alkaline Phos 39 - 117 U/L   73   AST 0 - 37 U/L   20   ALT 0 - 35 U/L   14       Assessment & Plan:   Problem List Items Addressed This Visit       Cardiovascular and Mediastinum   Essential hypertension - Primary   Misty Mason 7-day average blood pressure at home has been 137/78. Today's BP is at goal. I recommend she continue amlodipine -valsartan  5-160 mg 1/2 tab (2.5-80 mg) daily.        Other   Class 1 obesity   Misty Mason has Class 1 obesity complicated by hypertension, prediabetes, and lumbar disc disease. Misty Mason is concerned with weight gain over time and inability to lose this with diet and exercise approaches. She contacted her insurer, who told her they could not tell her what her co-pay might be for this. They advised ehr to have her provider prescribe the medication and have a PA run. She would benefit from weight loss to improve her co-morbid conditions.      Relevant Medications   semaglutide -weight management (WEGOVY ) 0.25 MG/0.5ML SOAJ SQ injection   Hyponatremia   Improved on last blood draw. Recommend avoiding thiazide diuretics.        Return in about 3 months (around 06/06/2024) for Reassessment.    [name], MD  I,[name],acting as a scribe for [name], MD.,have documented all relevant documentation on the behalf of [name], MD.  LILLETTE Garnette CHRISTELLA  Irmgard Rampersaud, MD, have reviewed all documentation for this visit. The documentation on 03/09/2024 for the exam, diagnosis, procedures, and orders are all accurate and complete.      [1] No Known Allergies  "

## 2024-03-09 NOTE — Assessment & Plan Note (Signed)
 Improved on last blood draw. Recommend avoiding thiazide diuretics.

## 2024-03-09 NOTE — Assessment & Plan Note (Addendum)
 Misty Mason 7-day average blood pressure at home has been 137/78. Today's BP is at goal. I recommend she continue amlodipine -valsartan  5-160 mg 1/2 tab (2.5-80 mg) daily.

## 2024-05-01 ENCOUNTER — Ambulatory Visit: Admitting: Family Medicine

## 2024-06-04 ENCOUNTER — Ambulatory Visit

## 2024-06-06 ENCOUNTER — Ambulatory Visit: Admitting: Family Medicine
